# Patient Record
Sex: Female | Born: 1955 | Race: White | Hispanic: No | Marital: Single | Smoking: Former smoker
Health system: Southern US, Community
[De-identification: ages and names within clinical notes are randomized; demographics above are authoritative.]

## PROBLEM LIST (undated history)

## (undated) DIAGNOSIS — E119 Type 2 diabetes mellitus without complications: Secondary | ICD-10-CM

## (undated) DIAGNOSIS — J189 Pneumonia, unspecified organism: Secondary | ICD-10-CM

## (undated) DIAGNOSIS — J309 Allergic rhinitis, unspecified: Secondary | ICD-10-CM

## (undated) DIAGNOSIS — B977 Papillomavirus as the cause of diseases classified elsewhere: Secondary | ICD-10-CM

## (undated) DIAGNOSIS — F329 Major depressive disorder, single episode, unspecified: Secondary | ICD-10-CM

## (undated) DIAGNOSIS — F419 Anxiety disorder, unspecified: Secondary | ICD-10-CM

## (undated) DIAGNOSIS — M1990 Unspecified osteoarthritis, unspecified site: Secondary | ICD-10-CM

## (undated) DIAGNOSIS — F988 Other specified behavioral and emotional disorders with onset usually occurring in childhood and adolescence: Secondary | ICD-10-CM

## (undated) DIAGNOSIS — F32A Depression, unspecified: Secondary | ICD-10-CM

## (undated) DIAGNOSIS — E785 Hyperlipidemia, unspecified: Secondary | ICD-10-CM

## (undated) DIAGNOSIS — I10 Essential (primary) hypertension: Secondary | ICD-10-CM

## (undated) HISTORY — DX: Other specified behavioral and emotional disorders with onset usually occurring in childhood and adolescence: F98.8

## (undated) HISTORY — DX: Hyperlipidemia, unspecified: E78.5

## (undated) HISTORY — PX: OTHER SURGICAL HISTORY: SHX169

## (undated) HISTORY — DX: Allergic rhinitis, unspecified: J30.9

## (undated) HISTORY — PX: ENDOMETRIAL BIOPSY: SHX622

## (undated) HISTORY — DX: Anxiety disorder, unspecified: F41.9

## (undated) HISTORY — DX: Type 2 diabetes mellitus without complications: E11.9

## (undated) HISTORY — DX: Depression, unspecified: F32.A

## (undated) HISTORY — DX: Papillomavirus as the cause of diseases classified elsewhere: B97.7

## (undated) HISTORY — DX: Essential (primary) hypertension: I10

## (undated) HISTORY — PX: LIPOSUCTION: SHX10

## (undated) HISTORY — DX: Major depressive disorder, single episode, unspecified: F32.9

## (undated) HISTORY — PX: BREAST REDUCTION SURGERY: SHX8

---

## 2013-03-13 ENCOUNTER — Other Ambulatory Visit: Payer: Self-pay | Admitting: Family Medicine

## 2013-05-20 ENCOUNTER — Encounter: Payer: Self-pay | Admitting: *Deleted

## 2013-05-20 ENCOUNTER — Ambulatory Visit (INDEPENDENT_AMBULATORY_CARE_PROVIDER_SITE_OTHER): Payer: BC Managed Care – PPO | Admitting: Family Medicine

## 2013-05-20 ENCOUNTER — Encounter: Payer: Self-pay | Admitting: Family Medicine

## 2013-05-20 VITALS — BP 116/73 | HR 112 | Wt 223.0 lb

## 2013-05-20 DIAGNOSIS — F411 Generalized anxiety disorder: Secondary | ICD-10-CM

## 2013-05-20 DIAGNOSIS — R41840 Attention and concentration deficit: Secondary | ICD-10-CM

## 2013-05-20 DIAGNOSIS — IMO0001 Reserved for inherently not codable concepts without codable children: Secondary | ICD-10-CM

## 2013-05-20 DIAGNOSIS — R300 Dysuria: Secondary | ICD-10-CM

## 2013-05-20 LAB — POCT URINALYSIS DIPSTICK
Bilirubin, UA: NEGATIVE
Blood, UA: NEGATIVE
Glucose, UA: POSITIVE
Ketones, UA: NEGATIVE
Leukocytes, UA: NEGATIVE
Nitrite, UA: NEGATIVE
Protein, UA: NEGATIVE
Spec Grav, UA: 1.02
Urobilinogen, UA: NEGATIVE
pH, UA: 5

## 2013-05-20 MED ORDER — PHENAZOPYRIDINE HCL 200 MG PO TABS: 200.0000 mg | ORAL_TABLET | Freq: Three times a day (TID) | ORAL | Status: DC | PRN

## 2013-05-20 MED ORDER — AMPHETAMINE-DEXTROAMPHET ER 30 MG PO CP24: 30.0000 mg | ORAL_CAPSULE | ORAL | Status: DC

## 2013-05-20 MED ORDER — VENLAFAXINE HCL ER 150 MG PO CP24: 150.0000 mg | ORAL_CAPSULE | Freq: Every day | ORAL | Status: DC

## 2013-05-20 MED ORDER — CANAGLIFLOZIN 300 MG PO TABS: 1.0000 | ORAL_TABLET | Freq: Every day | ORAL | Status: DC

## 2013-05-20 MED ORDER — LIRAGLUTIDE 18 MG/3ML ~~LOC~~ SOPN: 1.8000 mg | PEN_INJECTOR | Freq: Every day | SUBCUTANEOUS | Status: DC

## 2013-05-20 MED ORDER — METFORMIN HCL ER (MOD) 1000 MG PO TB24: 1000.0000 mg | ORAL_TABLET | Freq: Two times a day (BID) | ORAL | Status: DC

## 2013-05-20 NOTE — Progress Notes (Signed)
  Subjective:    Patient ID: Danielle Riley, female    DOB: Mar 12, 1956, 57 y.o.   MRN: [id number]   HPI  Danielle Riley is here today to discuss the conditions listed below and for medication refills.   1)  Dysuria:  She has been having some pain with urination and thinks that she might have a UTI.  She describes her discomfort as being moderate and she feels that she is worsening.  She has been taking cranberry supplements which have not helped her.    2)  ADD:  She needs her refill on her Adderall. She is doing fine on her current dosage.    3)  Type II DM:  She is doing fine on the combination of Invokana, metformin and Victoza.    4)  Hyperlipidemia:  She is doing well on pravastatin.     Review of Systems  Constitutional: Negative for activity change, fatigue and unexpected weight change.  HENT: Negative.   Eyes: Negative.   Respiratory: Negative for shortness of breath.   Cardiovascular: Negative for chest pain, palpitations and leg swelling.  Gastrointestinal: Negative for diarrhea and constipation.  Endocrine: Negative.   Genitourinary: Negative for difficulty urinating.  Musculoskeletal: Negative.   Skin: Negative.   Neurological: Negative.   Hematological: Negative for adenopathy. Does not bruise/bleed easily.  Psychiatric/Behavioral: Negative for sleep disturbance and dysphoric mood. The patient is not nervous/anxious.    Past Medical History  Diagnosis Date  . Hypertension   . Hyperlipidemia   . Diabetes   . ADD (attention deficit disorder)   . Anxiety   . Depression   . Allergic rhinitis   . High risk HPV infection       Family History  Problem Relation Age of Onset  . Depression Mother   . Diabetes Paternal Grandmother     History   Social History Narrative   Marital Status: Single   Children:  None    Pets: None   Living Situation: Lives alone    Occupation:  [employer]) [place]    Education:  Manufacturing engineer (Education); Specialist  ([place]) Alcohol Use:  None   Tobacco Use:  Former smoker   Alcohol:  Occasional    Drug Use:  None   Diet:  Regular   Exercise:  Gym Engineer, materials) 2 X per week   Hobbies: Knitting, Crafts,  [employer]          Objective:   Physical Exam  Constitutional: She appears well-nourished. No distress.  HENT:  Head: Normocephalic.  Eyes: No scleral icterus.  Neck: No thyromegaly present.  Cardiovascular: Normal rate, regular rhythm and normal heart sounds.   Pulmonary/Chest: Effort normal and breath sounds normal.  Abdominal: There is no tenderness.  Musculoskeletal: She exhibits no edema and no tenderness.  Neurological: She is alert.  Skin: Skin is warm and dry.  Psychiatric: She has a normal mood and affect. Her behavior is normal. Judgment and thought content normal.      Assessment & Plan:

## 2013-05-24 ENCOUNTER — Ambulatory Visit: Payer: Self-pay | Admitting: Family Medicine

## 2013-05-28 ENCOUNTER — Other Ambulatory Visit: Payer: Self-pay | Admitting: Family Medicine

## 2013-06-13 ENCOUNTER — Encounter: Payer: Self-pay | Admitting: Family Medicine

## 2013-06-13 DIAGNOSIS — F411 Generalized anxiety disorder: Secondary | ICD-10-CM | POA: Insufficient documentation

## 2013-06-13 DIAGNOSIS — IMO0001 Reserved for inherently not codable concepts without codable children: Secondary | ICD-10-CM | POA: Insufficient documentation

## 2013-06-13 DIAGNOSIS — R300 Dysuria: Secondary | ICD-10-CM | POA: Insufficient documentation

## 2013-06-13 DIAGNOSIS — R41840 Attention and concentration deficit: Secondary | ICD-10-CM | POA: Insufficient documentation

## 2013-06-13 NOTE — Assessment & Plan Note (Signed)
Refilled her Invokana, metformin and Victoza.

## 2013-06-13 NOTE — Assessment & Plan Note (Signed)
Her urine does not appear to be infected.  She was given some Pyridium to help with her dysuria.

## 2013-06-13 NOTE — Assessment & Plan Note (Signed)
Refilled her Adderall XR.

## 2013-06-13 NOTE — Assessment & Plan Note (Signed)
Refilled her Effexor

## 2013-06-17 ENCOUNTER — Telehealth: Payer: Self-pay

## 2013-06-17 NOTE — Telephone Encounter (Signed)
I called to get a prior auth for Adderall medication. Approval is from 05/27/2013- 06/17/2014 and the case# is [id number]  The information above has been faxed to the pharmacy at this time. LB

## 2013-08-20 ENCOUNTER — Ambulatory Visit: Payer: BC Managed Care – PPO | Admitting: Family Medicine

## 2013-08-27 ENCOUNTER — Ambulatory Visit: Payer: BC Managed Care – PPO | Admitting: Family Medicine

## 2013-08-27 ENCOUNTER — Ambulatory Visit (INDEPENDENT_AMBULATORY_CARE_PROVIDER_SITE_OTHER): Payer: BC Managed Care – PPO | Admitting: Family Medicine

## 2013-08-27 ENCOUNTER — Encounter: Payer: Self-pay | Admitting: Family Medicine

## 2013-08-27 VITALS — BP 130/78 | HR 90 | Resp 16 | Ht 65.0 in | Wt 220.0 lb

## 2013-08-27 DIAGNOSIS — E781 Pure hyperglyceridemia: Secondary | ICD-10-CM

## 2013-08-27 DIAGNOSIS — T2220XA Burn of second degree of shoulder and upper limb, except wrist and hand, unspecified site, initial encounter: Secondary | ICD-10-CM

## 2013-08-27 DIAGNOSIS — G4700 Insomnia, unspecified: Secondary | ICD-10-CM

## 2013-08-27 DIAGNOSIS — R41840 Attention and concentration deficit: Secondary | ICD-10-CM

## 2013-08-27 DIAGNOSIS — I10 Essential (primary) hypertension: Secondary | ICD-10-CM

## 2013-08-27 DIAGNOSIS — N951 Menopausal and female climacteric states: Secondary | ICD-10-CM

## 2013-08-27 DIAGNOSIS — D649 Anemia, unspecified: Secondary | ICD-10-CM

## 2013-08-27 DIAGNOSIS — F411 Generalized anxiety disorder: Secondary | ICD-10-CM

## 2013-08-27 DIAGNOSIS — Z23 Encounter for immunization: Secondary | ICD-10-CM

## 2013-08-27 DIAGNOSIS — E119 Type 2 diabetes mellitus without complications: Secondary | ICD-10-CM

## 2013-08-27 LAB — POCT GLYCOSYLATED HEMOGLOBIN (HGB A1C): Hemoglobin A1C: 6.7

## 2013-08-27 MED ORDER — AMPHETAMINE-DEXTROAMPHET ER 30 MG PO CP24: 30.0000 mg | ORAL_CAPSULE | ORAL | Status: DC

## 2013-08-27 MED ORDER — LIRAGLUTIDE 18 MG/3ML ~~LOC~~ SOPN: 1.8000 mg | PEN_INJECTOR | Freq: Every day | SUBCUTANEOUS | Status: DC

## 2013-08-27 MED ORDER — MULTIGEN PLUS 151-60-1 MG PO TABS: 1.0000 | ORAL_TABLET | Freq: Two times a day (BID) | ORAL | Status: DC

## 2013-08-27 MED ORDER — PROGESTERONE MICRONIZED 100 MG PO CAPS: 100.0000 mg | ORAL_CAPSULE | Freq: Every day | ORAL | Status: DC

## 2013-08-27 MED ORDER — LISINOPRIL-HYDROCHLOROTHIAZIDE 20-25 MG PO TABS: 1.0000 | ORAL_TABLET | Freq: Every day | ORAL | Status: DC

## 2013-08-27 MED ORDER — OMEGA-3-ACID ETHYL ESTERS 1 G PO CAPS: 2.0000 g | ORAL_CAPSULE | Freq: Two times a day (BID) | ORAL | Status: DC

## 2013-08-27 NOTE — Progress Notes (Signed)
Subjective:    Patient ID: Danielle Riley, female    DOB: 1956-07-13, 57 y.o.   MRN: [id number]  HPI  Danielle Riley is here today to discuss the conditions listed below and to have several of her medications refilled.     1)  Burn:  She burned her upper left arm a few days ago while using a hot glue stick at work.  She has been keeping the wound clean and has been applying Neosporin and an OTC silver gel the pharmacist told her to get.  She would like to have the burn evaluated to be sure that it is not infected.  She denies pain, warmth or discharge from the wound.    2) Type II DM:  She had been taking the combination of Victoza, Metformin and Invokana.  She did run out of Victoza a few weeks ago and can tell that her sugars have increased.  She continues to work hard on her diet.    3)  ADD: She needs a refill of her ADD medication (Adderall XR 30 mg).  She says that the medication continues to help her concentrate at work.  She feels that this dosage is appropriate and would like to continue on it.    4)  Mood:  She continues to do well with her Effexor 150 mg.  She needs a refill on it as well.       Review of Systems  Constitutional: Negative.   HENT: Negative.   Eyes: Negative.   Respiratory: Negative.   Cardiovascular: Negative.   Gastrointestinal: Negative.   Endocrine: Negative.   Genitourinary: Negative.   Musculoskeletal: Negative.   Skin: Positive for wound.       left upper arm.   Allergic/Immunologic: Negative.   Neurological: Negative.   Hematological: Negative.   Psychiatric/Behavioral: Negative.      Past Medical History  Diagnosis Date  . Hypertension   . Hyperlipidemia   . Diabetes   . ADD (attention deficit disorder)   . Anxiety   . Depression   . Allergic rhinitis   . High risk HPV infection      Family History  Problem Relation Age of Onset  . Depression Mother   . 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 Constitutional: She is oriented to person, place, and time. She appears well-developed and well-nourished.  Eyes: Conjunctivae are normal. No scleral icterus.  Neck: Neck supple. No thyromegaly present.  Cardiovascular: Normal rate, regular rhythm and normal heart sounds.   Pulmonary/Chest: Effort normal and breath sounds normal.  Musculoskeletal: She exhibits no edema and no tenderness.       Arms: Lymphadenopathy:    She has no cervical adenopathy.  Neurological: She is alert and oriented to person, place, and time.  Skin: Skin is warm and dry.  Psychiatric: She has a  normal mood and affect. Her behavior is normal. Judgment and thought content normal.          Assessment & Plan:

## 2013-09-07 ENCOUNTER — Other Ambulatory Visit: Payer: Self-pay | Admitting: Family Medicine

## 2013-10-31 DIAGNOSIS — E119 Type 2 diabetes mellitus without complications: Secondary | ICD-10-CM | POA: Insufficient documentation

## 2013-10-31 DIAGNOSIS — E781 Pure hyperglyceridemia: Secondary | ICD-10-CM | POA: Insufficient documentation

## 2013-10-31 DIAGNOSIS — I10 Essential (primary) hypertension: Secondary | ICD-10-CM | POA: Insufficient documentation

## 2013-10-31 DIAGNOSIS — N951 Menopausal and female climacteric states: Secondary | ICD-10-CM | POA: Insufficient documentation

## 2013-10-31 DIAGNOSIS — D649 Anemia, unspecified: Secondary | ICD-10-CM | POA: Insufficient documentation

## 2013-10-31 DIAGNOSIS — G4700 Insomnia, unspecified: Secondary | ICD-10-CM | POA: Insufficient documentation

## 2013-10-31 DIAGNOSIS — T2220XA Burn of second degree of shoulder and upper limb, except wrist and hand, unspecified site, initial encounter: Secondary | ICD-10-CM | POA: Insufficient documentation

## 2013-10-31 DIAGNOSIS — Z23 Encounter for immunization: Secondary | ICD-10-CM | POA: Insufficient documentation

## 2013-10-31 NOTE — Assessment & Plan Note (Signed)
Refilled her Adderall XR.

## 2013-10-31 NOTE — Assessment & Plan Note (Signed)
She was given a refill for her Lovaza.

## 2013-10-31 NOTE — Assessment & Plan Note (Signed)
The patient confirmed that they are not allergic to eggs and have never had a bad reaction with the flu shot in the past.  The vaccination was given without difficulty.   

## 2013-10-31 NOTE — Assessment & Plan Note (Addendum)
Her mood is stable on Effexor so she will remain on the 150 mg which was refilled.

## 2013-10-31 NOTE — Assessment & Plan Note (Signed)
Danielle Riley's A1c is a little better than it was at her last check 6 months ago (6.7% vs 6.8%).  She is going to continue on the combination of Victoza, metformin and Invokana and will continue to work on her diet and exercise.

## 2013-10-31 NOTE — Assessment & Plan Note (Signed)
Her BP is controlled on the lisinopril/HCT so she'll remain on her current dosage.

## 2013-10-31 NOTE — Assessment & Plan Note (Signed)
She is to continue on the Prometrium to help both her mood and her sleep.

## 2013-10-31 NOTE — Assessment & Plan Note (Signed)
Refilled her Multigen.

## 2013-11-16 ENCOUNTER — Other Ambulatory Visit: Payer: Self-pay | Admitting: *Deleted

## 2013-11-16 DIAGNOSIS — E119 Type 2 diabetes mellitus without complications: Secondary | ICD-10-CM

## 2013-11-16 DIAGNOSIS — R5381 Other malaise: Secondary | ICD-10-CM

## 2013-11-16 DIAGNOSIS — R5383 Other fatigue: Secondary | ICD-10-CM

## 2013-11-16 DIAGNOSIS — E785 Hyperlipidemia, unspecified: Secondary | ICD-10-CM

## 2013-11-17 ENCOUNTER — Other Ambulatory Visit: Payer: BC Managed Care – PPO

## 2013-11-17 LAB — COMPLETE METABOLIC PANEL WITHOUT GFR
ALT: 14 U/L (ref 0–35)
AST: 14 U/L (ref 0–37)
Albumin: 4.3 g/dL (ref 3.5–5.2)
Alkaline Phosphatase: 87 U/L (ref 39–117)
BUN: 18 mg/dL (ref 6–23)
CO2: 27 meq/L (ref 19–32)
Calcium: 9.6 mg/dL (ref 8.4–10.5)
Chloride: 101 meq/L (ref 96–112)
Creat: 0.57 mg/dL (ref 0.50–1.10)
GFR, Est African American: >89 mL/min
GFR, Est Non African American: >89 mL/min
Glucose, Bld: 225 mg/dL — ABNORMAL HIGH (ref 70–99)
Potassium: 4.6 meq/L (ref 3.5–5.3)
Sodium: 136 meq/L (ref 135–145)
Total Bilirubin: 0.6 mg/dL (ref 0.3–1.2)
Total Protein: 6.7 g/dL (ref 6.0–8.3)

## 2013-11-17 LAB — CBC WITH DIFFERENTIAL/PLATELET
Basophils Absolute: 0 10*3/uL (ref 0.0–0.1)
Basophils Relative: 1 % (ref 0–1)
Eosinophils Absolute: 0.2 10*3/uL (ref 0.0–0.7)
Eosinophils Relative: 3 % (ref 0–5)
HCT: 42.5 % (ref 36.0–46.0)
Hemoglobin: 14.4 g/dL (ref 12.0–15.0)
Lymphocytes Relative: 46 % (ref 12–46)
Lymphs Abs: 2.9 10*3/uL (ref 0.7–4.0)
MCH: 28.6 pg (ref 26.0–34.0)
MCHC: 33.9 g/dL (ref 30.0–36.0)
MCV: 84.5 fL (ref 78.0–100.0)
Monocytes Absolute: 0.4 10*3/uL (ref 0.1–1.0)
Monocytes Relative: 7 % (ref 3–12)
Neutro Abs: 2.7 10*3/uL (ref 1.7–7.7)
Neutrophils Relative %: 43 % (ref 43–77)
Platelets: 285 10*3/uL (ref 150–400)
RBC: 5.03 MIL/uL (ref 3.87–5.11)
RDW: 13.8 % (ref 11.5–15.5)
WBC: 6.3 10*3/uL (ref 4.0–10.5)

## 2013-11-17 LAB — TSH: TSH: 1.165 u[IU]/mL (ref 0.350–4.500)

## 2013-11-17 LAB — LIPID PANEL
Cholesterol: 186 mg/dL (ref 0–200)
HDL: 58 mg/dL
LDL Cholesterol: 102 mg/dL — ABNORMAL HIGH (ref 0–99)
Total CHOL/HDL Ratio: 3.2 ratio
Triglycerides: 132 mg/dL
VLDL: 26 mg/dL (ref 0–40)

## 2013-11-18 LAB — HEMOGLOBIN A1C
Hgb A1c MFr Bld: 9.3 % — ABNORMAL HIGH (ref <5.7)
Mean Plasma Glucose: 220 mg/dL — ABNORMAL HIGH (ref <117)

## 2013-11-22 ENCOUNTER — Other Ambulatory Visit: Payer: BC Managed Care – PPO

## 2013-11-26 ENCOUNTER — Ambulatory Visit (INDEPENDENT_AMBULATORY_CARE_PROVIDER_SITE_OTHER): Payer: BC Managed Care – PPO | Admitting: Family Medicine

## 2013-11-26 ENCOUNTER — Encounter (INDEPENDENT_AMBULATORY_CARE_PROVIDER_SITE_OTHER): Payer: Self-pay

## 2013-11-26 ENCOUNTER — Encounter: Payer: Self-pay | Admitting: Family Medicine

## 2013-11-26 VITALS — BP 130/83 | HR 102 | Resp 16 | Wt 222.0 lb

## 2013-11-26 DIAGNOSIS — E119 Type 2 diabetes mellitus without complications: Secondary | ICD-10-CM

## 2013-11-26 DIAGNOSIS — I10 Essential (primary) hypertension: Secondary | ICD-10-CM

## 2013-11-26 DIAGNOSIS — R41840 Attention and concentration deficit: Secondary | ICD-10-CM

## 2013-11-26 MED ORDER — LIRAGLUTIDE 18 MG/3ML ~~LOC~~ SOPN: 1.8000 mg | PEN_INJECTOR | Freq: Every day | SUBCUTANEOUS | Status: DC

## 2013-11-26 MED ORDER — ALBIGLUTIDE 30 MG ~~LOC~~ PEN: 30.0000 mg | PEN_INJECTOR | SUBCUTANEOUS | Status: AC

## 2013-11-26 MED ORDER — VENLAFAXINE HCL ER 150 MG PO CP24: 150.0000 mg | ORAL_CAPSULE | Freq: Every day | ORAL | Status: DC

## 2013-11-26 MED ORDER — CANAGLIFLOZIN-METFORMIN HCL 150-1000 MG PO TABS: 1.0000 | ORAL_TABLET | Freq: Two times a day (BID) | ORAL | Status: DC

## 2013-11-26 MED ORDER — AMPHETAMINE-DEXTROAMPHET ER 30 MG PO CP24: 30.0000 mg | ORAL_CAPSULE | ORAL | Status: DC

## 2013-11-26 MED ORDER — LISINOPRIL-HYDROCHLOROTHIAZIDE 20-25 MG PO TABS: 1.0000 | ORAL_TABLET | Freq: Every day | ORAL | Status: DC

## 2013-11-26 NOTE — Progress Notes (Signed)
Subjective:    Patient ID: Danielle Riley, female    DOB: 1956-06-19, 57 y.o.   MRN: [id number]  HPI  Danielle Riley is here today to go over his most recent lab results and to discuss the conditions listed below:    1)  Type II DM:  She has been very stressed with her new job and admits not taking her medications as she should.  She took the samples of Invokomet and did well on them but did not get it filled because she lost the prescription.    2)   ADD:  She is here today to get a refill on her ADD medication (Adderall XR 30 mg).  She says that the medication continues to help her concentrate at work.  She feels that this dosage is appropriate and would like to continue on it.    3)  Hyperlipidemia:  She is doing well on her pravastatin (40 mg once, daily).  She needs a refill on it.    4)  Hypertension:  Her blood pressure is controlled with her Lisinopril/HCTZ (20-25 mg, 1/2 pill daily)   5)  Anxiety/Depression:  She continues taking her Effexor.  She is having a lot of stress at work.     Review of Systems  Constitutional: Positive for fatigue. Negative for activity change, appetite change and unexpected weight change.  HENT: Negative.   Eyes: Negative.   Respiratory: Negative for chest tightness and shortness of breath.   Cardiovascular: Negative for chest pain, palpitations and leg swelling.  Gastrointestinal: Negative for diarrhea and constipation.  Endocrine: Negative.  Negative for polydipsia, polyphagia and polyuria.  Genitourinary: Negative for urgency, frequency and difficulty urinating.  Musculoskeletal: Negative.   Skin: Negative.   Neurological: Negative.  Negative for light-headedness and numbness.  Hematological: Negative for adenopathy. Does not bruise/bleed easily.  Psychiatric/Behavioral: Negative for sleep disturbance and dysphoric mood. The patient is not nervous/anxious.        She is very stressed.      Past Medical History  Diagnosis Date  . Hypertension    . Hyperlipidemia   . Diabetes   . ADD (attention deficit disorder)   . Anxiety   . Depression   . Allergic rhinitis   . High risk HPV infection      Past Surgical History  Procedure Laterality Date  . Endometrial biopsy    . Breast reduction surgery    . 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 Depression Mother   . Diabetes Paternal Grandmother      Current Outpatient Prescriptions on File Prior to Visit  Medication Sig Dispense Refill  . aspirin 81 MG tablet Take 81 mg by mouth daily.      Marland Kitchen EPIPEN 2-PAK 0.3 MG/0.3ML SOAJ Inject 0.3 mg into  the muscle once as needed (for allergies).       . FeAsp-FeFum -Suc-C-Thre-B12-FA (MULTIGEN PLUS) 50-101-1 MG TABS Take 1 tablet by mouth 2 (two) times daily.  180 each  3  . Lancets (ONETOUCH ULTRASOFT) lancets       . omega-3 acid ethyl esters (LOVAZA) 1 G capsule Take 2 capsules (2 g total) by mouth 2 (two) times daily.  120 capsule  11  . ONE TOUCH ULTRA TEST test strip       . pravastatin (PRAVACHOL) 40 MG tablet Take 40 mg by mouth daily.       . progesterone (PROMETRIUM) 100 MG capsule Take 1 capsule (100 mg total) by mouth at bedtime.  90 capsule  3   No current facility-administered medications on file prior to visit.     Allergies  Allergen Reactions  . Lemon Oil Anaphylaxis  . Other Anaphylaxis    pickles  . Advil [Ibuprofen] Swelling  . Peanuts [Peanut Oil] Diarrhea    Reports meds are okay; only  allergic to food     Immunization History  Administered Date(s) Administered  . Influenza,inj,Quad PF,36+ Mos 08/27/2013  . Td 05/13/2003  . Tdap 09/19/2009       Objective:   Physical Exam  Vitals reviewed. Constitutional: She is oriented to person, place, and time. She appears well-developed and well-nourished. She does not appear ill.  HENT:  Head: Normocephalic and atraumatic. Hair is normal.  Mouth/Throat: Oropharynx is clear and moist.  Eyes: Conjunctivae are normal. No scleral icterus.  Neck: Neck supple. No JVD present. No thyromegaly present.  Cardiovascular: Normal rate and regular rhythm.  Exam reveals no gallop and no friction rub.   No murmur heard. Pulmonary/Chest: Effort normal and breath sounds normal. No respiratory distress. She has no wheezes. She exhibits no tenderness.  Abdominal: Soft. She exhibits no distension and no mass. There is no tenderness.  Musculoskeletal: Normal range of motion. She exhibits no edema and no tenderness.  Lymphadenopathy:    She has no cervical adenopathy.  Neurological: She is alert and oriented to person, place, and time.  Skin: Skin is warm and dry. No rash noted.  Psychiatric: She has a normal mood and affect. Her behavior is normal. Judgment and thought content normal.  She appears stressed.       Assessment & Plan:    Danine was seen today for medication management.  Diagnoses and associated orders for this visit:    We are going to make some changes in her diabetic medications to get her sugar under better control.    Concentration deficit - amphetamine-dextroamphetamine (ADDERALL XR) 30 MG 24 hr capsule; Take 1 capsule (30 mg total) by mouth every morning.  Essential hypertension, benign - lisinopril-hydrochlorothiazide (PRINZIDE,ZESTORETIC) 20-25 MG per tablet; Take 1 tablet by mouth daily.  Type II or unspecified type diabetes mellitus without mention of complication, not stated as uncontrolled - Albiglutide 30  MG PEN; Inject 30 mg into the skin once a week. - Liraglutide (VICTOZA) 18 MG/3ML SOPN; Inject 1.8 mg into the skin daily.  Other Orders - venlafaxine XR (EFFEXOR-XR) 150 MG 24 hr capsule; Take 1 capsule (150 mg total) by mouth daily with breakfast. - Canagliflozin-Metformin HCl ([phone] MG TABS; Take 1 tablet by mouth 2 (two) times daily.

## 2014-01-26 ENCOUNTER — Encounter (HOSPITAL_COMMUNITY): Payer: Self-pay | Admitting: Emergency Medicine

## 2014-01-26 ENCOUNTER — Emergency Department (HOSPITAL_COMMUNITY)
Admission: EM | Admit: 2014-01-26 | Discharge: 2014-01-26 | Disposition: A | Discharge status: Home / Self Care | Payer: Worker's Compensation | Attending: Emergency Medicine | Admitting: Emergency Medicine

## 2014-01-26 DIAGNOSIS — R209 Unspecified disturbances of skin sensation: Secondary | ICD-10-CM | POA: Insufficient documentation

## 2014-01-26 DIAGNOSIS — E119 Type 2 diabetes mellitus without complications: Secondary | ICD-10-CM | POA: Insufficient documentation

## 2014-01-26 DIAGNOSIS — Z7982 Long term (current) use of aspirin: Secondary | ICD-10-CM | POA: Insufficient documentation

## 2014-01-26 DIAGNOSIS — Z8709 Personal history of other diseases of the respiratory system: Secondary | ICD-10-CM | POA: Insufficient documentation

## 2014-01-26 DIAGNOSIS — Z87891 Personal history of nicotine dependence: Secondary | ICD-10-CM | POA: Insufficient documentation

## 2014-01-26 DIAGNOSIS — Z79899 Other long term (current) drug therapy: Secondary | ICD-10-CM | POA: Insufficient documentation

## 2014-01-26 DIAGNOSIS — I10 Essential (primary) hypertension: Secondary | ICD-10-CM | POA: Insufficient documentation

## 2014-01-26 DIAGNOSIS — Z8619 Personal history of other infectious and parasitic diseases: Secondary | ICD-10-CM | POA: Insufficient documentation

## 2014-01-26 DIAGNOSIS — X500XXA Overexertion from strenuous movement or load, initial encounter: Secondary | ICD-10-CM | POA: Insufficient documentation

## 2014-01-26 DIAGNOSIS — Y929 Unspecified place or not applicable: Secondary | ICD-10-CM | POA: Insufficient documentation

## 2014-01-26 DIAGNOSIS — E785 Hyperlipidemia, unspecified: Secondary | ICD-10-CM | POA: Insufficient documentation

## 2014-01-26 DIAGNOSIS — M549 Dorsalgia, unspecified: Secondary | ICD-10-CM

## 2014-01-26 DIAGNOSIS — Y939 Activity, unspecified: Secondary | ICD-10-CM | POA: Insufficient documentation

## 2014-01-26 DIAGNOSIS — F988 Other specified behavioral and emotional disorders with onset usually occurring in childhood and adolescence: Secondary | ICD-10-CM | POA: Insufficient documentation

## 2014-01-26 DIAGNOSIS — IMO0002 Reserved for concepts with insufficient information to code with codable children: Secondary | ICD-10-CM | POA: Insufficient documentation

## 2014-01-26 DIAGNOSIS — F411 Generalized anxiety disorder: Secondary | ICD-10-CM | POA: Insufficient documentation

## 2014-01-26 MED ORDER — HYDROCODONE-ACETAMINOPHEN 5-325 MG PO TABS: 1.0000 | ORAL_TABLET | Freq: Four times a day (QID) | ORAL | Status: DC | PRN

## 2014-01-26 NOTE — ED Notes (Signed)
Burning pain in lower back radiating to buttocks and legs; pain is worse with position changes and better with palpation and pressure.

## 2014-01-26 NOTE — Discharge Instructions (Signed)
SEEK IMMEDIATE MEDICAL ATTENTION IF: New numbness, tingling, weakness, or problem with the use of your arms or legs.  Severe back pain not relieved with medications.  Change in bowel or bladder control.  Increasing pain in any areas of the body (such as chest or abdominal pain).  Shortness of breath, dizziness or fainting.  Nausea (feeling sick to your stomach), vomiting, fever, or sweats.  Do not drive, operate heavy machinery, drink alcohol, or take other tylenol containing products with this Norco..  Back Pain, Adult Low back pain is very common. About 1 in 5 people have back pain.The cause of low back pain is rarely dangerous. The pain often gets better over time.About half of people with a sudden onset of back pain feel better in just 2 weeks. About 8 in 10 people feel better by 6 weeks.  CAUSES Some common causes of back pain include:  Strain of the muscles or ligaments supporting the spine.  Wear and tear (degeneration) of the spinal discs.  Arthritis.  Direct injury to the back. DIAGNOSIS Most of the time, the direct cause of low back pain is not known.However, back pain can be treated effectively even when the exact cause of the pain is unknown.Answering your caregiver's questions about your overall health and symptoms is one of the most accurate ways to make sure the cause of your pain is not dangerous. If your caregiver needs more information, he or she may order lab work or imaging tests (X-rays or MRIs).However, even if imaging tests show changes in your back, this usually does not require surgery. HOME CARE INSTRUCTIONS For many people, back pain returns.Since low back pain is rarely dangerous, it is often a condition that people can learn to [facility] their own.   Remain active. It is stressful on the back to sit or stand in one place. Do not sit, drive, or stand in one place for more than 30 minutes at a time. Take short walks on level surfaces as soon as pain  allows.Try to increase the length of time you walk each day.  Do not stay in bed.Resting more than 1 or 2 days can delay your recovery.  Do not avoid exercise or work.Your body is made to move.It is not dangerous to be active, even though your back may hurt.Your back will likely heal faster if you return to being active before your pain is gone.  Pay attention to your body when you bend and lift. Many people have less discomfortwhen lifting if they bend their knees, keep the load close to their bodies,and avoid twisting. Often, the most comfortable positions are those that put less stress on your recovering back.  Find a comfortable position to sleep. Use a firm mattress and lie on your side with your knees slightly bent. If you lie on your back, put a pillow under your knees.  Only take over-the-counter or prescription medicines as directed by your caregiver. Over-the-counter medicines to reduce pain and inflammation are often the most helpful.Your caregiver may prescribe muscle relaxant drugs.These medicines help dull your pain so you can more quickly return to your normal activities and healthy exercise.  Put ice on the injured area.  Put ice in a plastic bag.  Place a towel between your skin and the bag.  Leave the ice on for 15-20 minutes, 03-04 times a day for the first 2 to 3 days. After that, ice and heat may be alternated to reduce pain and spasms.  Ask your caregiver about  trying back exercises and gentle massage. This may be of some benefit.  Avoid feeling anxious or stressed.Stress increases muscle tension and can worsen back pain.It is important to recognize when you are anxious or stressed and learn ways to manage it.Exercise is a great option. SEEK MEDICAL CARE IF:  You have pain that is not relieved with rest or medicine.  You have pain that does not improve in 1 week.  You have new symptoms.  You are generally not feeling well. SEEK IMMEDIATE MEDICAL CARE  IF:   You have pain that radiates from your back into your legs.  You develop new bowel or bladder control problems.  You have unusual weakness or numbness in your arms or legs.  You develop nausea or vomiting.  You develop abdominal pain.  You feel faint. Document Released: 12/09/2005 Document Revised: 06/09/2012 Document Reviewed: 04/29/2011 [facility] Patient Information 2014 [place], [place].

## 2014-01-26 NOTE — ED Notes (Signed)
Pt reports tripping this AM on a desk and throughout day her lower back has been hurting; reports tingling in legs; denies loss in bowel function; pt ambulatory to triage

## 2014-01-26 NOTE — ED Notes (Signed)
Notified lab of workers comp; pt has paperwork at bedside; pt instructed to not use bathroom until phlebotomy comes to bedside

## 2014-01-26 NOTE — ED Provider Notes (Signed)
CSN: [id number]     Arrival date & time 01/26/14  1907 History  This chart was scribed for non-physician practitioner working with Juliet RudeNathan R. Rubin PayorPickering, MD by Donne Anonayla Curran, ED Scribe. This patient was seen in room TR04C/TR04C and the patient's care was started at 1938.    First MD Initiated Contact with Patient 01/26/14 1938     Chief Complaint  Patient presents with  . Back Pain    The history is provided by the patient. No language interpreter was used.   HPI Comments: Danielle Riley is a 58 y.o. female who presents to the Emergency Department complaining of sudden onset, gradually worsening, moderate lower back pain that radiates into her buttock and is described as dull and burning that began. It began this morning when she tripped and hurt her back. She did not fall, hit her head or lose consciousness. She reports mild tingling in her legs. Standing up straight makes the pain worse. Pulling her knees up to her chest makes the pain better. She tried muscle ache relief patches on her back with moderate relief. She denies bowel or bladder incontinence, weakness in her lower extremities, rashes, neck pain, neck stiffness, HA, photophobia or any other minutes. She reports she has had similar back spasms before.   Past Medical History  Diagnosis Date  . Hypertension   . Hyperlipidemia   . Diabetes   . ADD (attention deficit disorder)   . Anxiety   . Depression   . Allergic rhinitis   . High risk HPV infection    Past Surgical History  Procedure Laterality Date  . Endometrial biopsy    . Breast reduction surgery    . Other surgical history      ECC   Family History  Problem Relation Age of Onset  . Depression Mother   . Diabetes Paternal Grandmother    History  Substance Use Topics  . Smoking status: Former Smoker    Types: Cigarettes    Quit date: 06/13/1990  . Smokeless tobacco: Never Used  . Alcohol Use: No   OB History   Grav Para Term Preterm Abortions TAB SAB Ect Mult  Living                 Review of Systems  Eyes: Negative for photophobia.  Musculoskeletal: Positive for back pain. Negative for neck pain and neck stiffness.  Skin: Negative for rash.  Neurological: Negative for headaches.    Allergies  Lemon oil; Other; Advil; and Peanuts  Home Medications   Current Outpatient Rx  Name  Route  Sig  Dispense  Refill  . Albiglutide 30 MG PEN   Subcutaneous   Inject 30 mg into the skin once a week.   4 each   5   . amphetamine-dextroamphetamine (ADDERALL XR) 30 MG 24 hr capsule   Oral   Take 1 capsule (30 mg total) by mouth every morning.   90 capsule   0   . aspirin 81 MG tablet   Oral   Take 81 mg by mouth daily.         . Canagliflozin-Metformin HCl [id number] MG TABS   Oral   Take 1 tablet by mouth 2 (two) times daily.   60 tablet   5   . EPIPEN 2-PAK 0.3 MG/0.3ML SOAJ               . FeAsp-FeFum -Suc-C-Thre-B12-FA (MULTIGEN PLUS) 50-101-1 MG TABS   Oral   Take 1 tablet by  mouth 2 (two) times daily.   180 each   3   . Lancets (ONETOUCH ULTRASOFT) lancets               . Liraglutide (VICTOZA) 18 MG/3ML SOPN   Subcutaneous   Inject 1.8 mg into the skin daily.   3 pen   5   . lisinopril-hydrochlorothiazide (PRINZIDE,ZESTORETIC) 20-25 MG per tablet   Oral   Take 1 tablet by mouth daily.   90 tablet   1   . omega-3 acid ethyl esters (LOVAZA) 1 G capsule   Oral   Take 2 capsules (2 g total) by mouth 2 (two) times daily.   120 capsule   11   . ONE TOUCH ULTRA TEST test strip               . pravastatin (PRAVACHOL) 40 MG tablet               . progesterone (PROMETRIUM) 100 MG capsule   Oral   Take 1 capsule (100 mg total) by mouth at bedtime.   90 capsule   3   . venlafaxine XR (EFFEXOR-XR) 150 MG 24 hr capsule   Oral   Take 1 capsule (150 mg total) by mouth daily with breakfast.   90 capsule   0    BP 148/72  Pulse 105  Temp(Src) 97.8 F (36.6 C) (Oral)  Resp 17  Ht 5\' 10"  (1.778 m)   Wt 225 lb (102.059 kg)  BMI 32.28 kg/m2  SpO2 96%  Physical Exam  Nursing note and vitals reviewed. Constitutional: She appears well-developed and well-nourished. No distress.  HENT:  Head: Normocephalic and atraumatic.  Eyes: Conjunctivae are normal.  Neck: Neck supple. No tracheal deviation present.  Cardiovascular: Normal rate.   Pulmonary/Chest: Effort normal. No respiratory distress.  Musculoskeletal: Normal range of motion. She exhibits tenderness.  No cervical, thoracic or lumbar midline tenderness to palpation. Extension and lateral flexion with pain. Normal gait. Normal coordination. Tender to palpation across sacrum, top of iliac crest and lower lumbar paraspinal muscles.   Neurological: She is alert.  Skin: Skin is warm and dry.  Psychiatric: She has a normal mood and affect. Her behavior is normal.    ED Course  Procedures (including critical care time) DIAGNOSTIC STUDIES: Oxygen Saturation is 96% on RA, adequate by my interpretation.    COORDINATION OF CARE: 8:23 PM Discussed treatment plan which includes NSAIDS, pain medication, stretching, heat/icewith pt at bedside and pt agreed to plan.    Labs Review Labs Reviewed - No data to display Imaging Review No results found.  EKG Interpretation   None       MDM   1. Back pain    Patient with back pain.  No neurological deficits and normal neuro exam.  Patient can walk but states is painful.  No loss of bowel or bladder control.  No concern for cauda equina.  No fever, night sweats, weight loss, h/o cancer, IVDU.  RICE protocol and pain medicine indicated and discussed with patient.   I personally performed the services described in this documentation, which was scribed in my presence. The recorded information has been reviewed and is accurate.     Arthor Captain, PA-C 01/28/14 1027

## 2014-01-26 NOTE — ED Notes (Signed)
Pt comfortable with d/c and f/u instructions. Prescriptions x1 

## 2014-01-31 NOTE — ED Provider Notes (Signed)
Medical screening examination/treatment/procedure(s) were performed by non-physician practitioner and as supervising physician I was immediately available for consultation/collaboration.  EKG Interpretation   None        Caige Almeda R. Ramonica Grigg, MD 01/31/14 2043 

## 2014-03-01 ENCOUNTER — Other Ambulatory Visit: Payer: Self-pay | Admitting: *Deleted

## 2014-03-01 MED ORDER — VENLAFAXINE HCL ER 150 MG PO CP24: 150.0000 mg | ORAL_CAPSULE | Freq: Every day | ORAL | Status: DC

## 2014-03-07 ENCOUNTER — Ambulatory Visit (INDEPENDENT_AMBULATORY_CARE_PROVIDER_SITE_OTHER): Payer: BC Managed Care – PPO | Admitting: Family Medicine

## 2014-03-07 ENCOUNTER — Encounter: Payer: Self-pay | Admitting: Family Medicine

## 2014-03-07 VITALS — BP 106/74 | HR 101 | Resp 16 | Wt 227.0 lb

## 2014-03-07 DIAGNOSIS — F411 Generalized anxiety disorder: Secondary | ICD-10-CM

## 2014-03-07 DIAGNOSIS — M549 Dorsalgia, unspecified: Secondary | ICD-10-CM

## 2014-03-07 DIAGNOSIS — IMO0001 Reserved for inherently not codable concepts without codable children: Secondary | ICD-10-CM

## 2014-03-07 DIAGNOSIS — E1165 Type 2 diabetes mellitus with hyperglycemia: Secondary | ICD-10-CM

## 2014-03-07 DIAGNOSIS — R41840 Attention and concentration deficit: Secondary | ICD-10-CM

## 2014-03-07 DIAGNOSIS — T782XXA Anaphylactic shock, unspecified, initial encounter: Secondary | ICD-10-CM

## 2014-03-07 LAB — POCT GLYCOSYLATED HEMOGLOBIN (HGB A1C): Hemoglobin A1C: 7.6

## 2014-03-07 MED ORDER — DICLOFENAC 35 MG PO CAPS: 1.0000 | ORAL_CAPSULE | Freq: Three times a day (TID) | ORAL | Status: DC

## 2014-03-07 MED ORDER — VENLAFAXINE HCL ER 150 MG PO CP24
150.0000 mg | ORAL_CAPSULE | Freq: Every day | ORAL | Status: DC
Start: 2014-03-07 — End: 2014-07-04

## 2014-03-07 MED ORDER — NAPROXEN-ESOMEPRAZOLE 500-20 MG PO TBEC: 1.0000 | DELAYED_RELEASE_TABLET | Freq: Two times a day (BID) | ORAL | Status: AC

## 2014-03-07 MED ORDER — TRAMADOL HCL 50 MG PO TABS: 50.0000 mg | ORAL_TABLET | Freq: Two times a day (BID) | ORAL | Status: AC | PRN

## 2014-03-07 MED ORDER — AMPHETAMINE-DEXTROAMPHET ER 30 MG PO CP24: 30.0000 mg | ORAL_CAPSULE | ORAL | Status: DC

## 2014-03-07 MED ORDER — CELECOXIB 200 MG PO CAPS: 200.0000 mg | ORAL_CAPSULE | Freq: Every day | ORAL | Status: AC

## 2014-03-07 MED ORDER — EPINEPHRINE 0.3 MG/0.3ML IJ SOAJ: 0.3000 mg | Freq: Once | INTRAMUSCULAR | Status: AC | PRN

## 2014-03-07 MED ORDER — CYCLOBENZAPRINE HCL 10 MG PO TABS: 10.0000 mg | ORAL_TABLET | Freq: Every day | ORAL | Status: DC

## 2014-03-07 NOTE — Patient Instructions (Signed)
1)  Blood Sugar - Your level is much better.  I anticipate it will be even better in 3 - 4 months with increased exercise.    2)  Generalized Pain - Try the anti-inflammatory meds and decide which one works the best for you.  You can add Tylenol 1000 mg up to 3 x per day +/- Flexeril +/- Ultram.    Chronic Pain Chronic pain can be defined as pain that is off and on and lasts for 3 6 months or longer. Many things cause chronic pain, which can make it difficult to make a diagnosis. There are many treatment options available for chronic pain. However, finding a treatment that works well for you may require trying various approaches until the right one is found. Many people benefit from a combination of two or more types of treatment to control their pain. SYMPTOMS  Chronic pain can occur anywhere in the body and can range from mild to very severe. Some types of chronic pain include:  Headache.  Low back pain.  Cancer pain.  Arthritis pain.  Neurogenic pain. This is pain resulting from damage to nerves. People with chronic pain may also have other symptoms such as:  Depression.  Anger.  Insomnia.  Anxiety. DIAGNOSIS  Your health care provider will help diagnose your condition over time. In many cases, the initial focus will be on excluding possible conditions that could be causing the pain. Depending on your symptoms, your health care provider may order tests to diagnose your condition. Some of these tests may include:   Blood tests.   CT scan.   MRI.   X-rays.   Ultrasounds.   Nerve conduction studies.  You may need to see a specialist.  TREATMENT  Finding treatment that works well may take time. You may be referred to a pain specialist. He or she may prescribe medicine or therapies, such as:   Mindful meditation or yoga.  Shots (injections) of numbing or pain-relieving medicines into the spine or area of pain.  Local electrical stimulation.  Acupuncture.    Massage therapy.   Aroma, color, light, or sound therapy.   Biofeedback.   Working with a physical therapist to keep from getting stiff.   Regular, gentle exercise.   Cognitive or behavioral therapy.   Group support.  Sometimes, surgery may be recommended.  HOME CARE INSTRUCTIONS   Take all medicines as directed by your health care provider.   Lessen stress in your life by relaxing and doing things such as listening to calming music.   Exercise or be active as directed by your health care provider.   Eat a healthy diet and include things such as vegetables, fruits, fish, and lean meats in your diet.   Keep all follow-up appointments with your health care provider.   Attend a support group with others suffering from chronic pain. SEEK MEDICAL CARE IF:   Your pain gets worse.   You develop a new pain that was not there before.   You cannot tolerate medicines given to you by your health care provider.   You have new symptoms since your last visit with your health care provider.  SEEK IMMEDIATE MEDICAL CARE IF:   You feel weak.   You have decreased sensation or numbness.   You lose control of bowel or bladder function.   Your pain suddenly gets much worse.   You develop shaking.  You develop chills.  You develop confusion.  You develop chest pain.  You  develop shortness of breath.  MAKE SURE YOU:  Understand these instructions.  Will watch your condition.  Will get help right away if you are not doing well or get worse. Document Released: 08/31/2002 Document Revised: 08/11/2013 Document Reviewed: 06/04/2013 [facility] Patient Information 2014 [place], [place].

## 2014-03-07 NOTE — Progress Notes (Signed)
Subjective:    Patient ID: Danielle BurnetNancy Jane Riley, female    DOB: [birth year], 57 y.o.   MRN: [id number]  HPI  Danielle Riley is here today to discuss the conditions listed below:   1)  ADD -  She is here today to get a refill on her ADD medication (Adderall XR 30 mg).  She says that the medication continues to help her concentrate at work.  She feels that this dosage is appropriate and would like to continue on it.    2)  Mood - She continues taking her Effexor and needs a refill on it.    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    Review of Systems  Constitutional: Positive for appetite change. Negative for fatigue and unexpected weight change.  Cardiovascular: Negative for chest pain and palpitations.  Musculoskeletal: Positive for back pain.  Neurological: Negative for dizziness and headaches.  Psychiatric/Behavioral: Negative for behavioral problems, sleep disturbance, dysphoric mood, decreased concentration and agitation. The patient is not nervous/anxious and is not hyperactive.      Past Medical History  Diagnosis Date  . Hypertension   . Hyperlipidemia   . Diabetes   . ADD (attention deficit disorder)   . Anxiety   . Depression   . Allergic rhinitis   . High risk HPV infection      Past Surgical History  Procedure Laterality Date  . Endometrial biopsy    . Breast reduction surgery    . 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 Depression  Mother   . Diabetes Paternal Grandmother      Current Outpatient Prescriptions on File Prior to Visit  Medication Sig Dispense Refill  . Albiglutide 30 MG PEN Inject 30 mg into the skin once a week.  4 each  5  . aspirin 81 MG tablet Take 81 mg by mouth daily.      . Canagliflozin-Metformin HCl [id number] MG TABS Take 1 tablet by mouth 2 (two) times daily.  60 tablet  5  . FeAsp-FeFum -Suc-C-Thre-B12-FA (MULTIGEN PLUS) 50-101-1 MG TABS Take 1 tablet by mouth 2 (two) times daily.  180 each  3  . Lancets (ONETOUCH ULTRASOFT) lancets       . lisinopril-hydrochlorothiazide (PRINZIDE,ZESTORETIC) 20-25 MG per tablet Take 1 tablet by mouth daily.  90 tablet  1  . omega-3 acid ethyl esters (LOVAZA) 1 G capsule Take 2 capsules (2 g total) by mouth 2 (two) times daily.  120 capsule  11  . ONE TOUCH ULTRA TEST test strip       . progesterone (PROMETRIUM) 100 MG capsule Take 1 capsule (100 mg total) by mouth at bedtime.  90 capsule  3   No current facility-administered medications on file prior to visit.  Allergies  Allergen Reactions  . Lemon Oil Anaphylaxis  . Other Anaphylaxis    pickles  . Advil [Ibuprofen] Swelling  . Peanuts [Peanut Oil] Diarrhea    Reports meds are okay; only allergic to food     Immunization History  Administered Date(s) Administered  . Influenza,inj,Quad PF,36+ Mos 08/27/2013  . Pneumococcal Polysaccharide-23 09/19/2009  . Td 05/13/2003  . Tdap 09/19/2009  . Zoster 04/19/2014       Objective:   Physical Exam  Constitutional: She appears well-nourished. No distress.  Cardiovascular: Normal rate, regular rhythm and normal heart sounds.   Pulmonary/Chest: Effort normal and breath sounds normal.  Neurological: She is alert.  Psychiatric: She has a normal mood and affect. Her behavior is normal. Judgment and thought content normal.      Assessment & Plan:    Danielle Riley was seen today for medication management.  Diagnoses and associated orders for this  visit:  Anxiety state, unspecified - venlafaxine XR (EFFEXOR-XR) 150 MG 24 hr capsule; Take 1 capsule (150 mg total) by mouth daily with breakfast.  Concentration deficit - amphetamine-dextroamphetamine (ADDERALL XR) 30 MG 24 hr capsule; Take 1 capsule (30 mg total) by mouth every morning.  Anaphylaxis - EPINEPHrine (EPIPEN 2-PAK) 0.3 mg/0.3 mL SOAJ injection; Inject 0.3 mLs (0.3 mg total) into the muscle once as needed (for allergies).  Type II or unspecified type diabetes mellitus without mention of complication, uncontrolled - POCT glycosylated hemoglobin (Hb A1C)  Backache - celecoxib (CELEBREX) 200 MG capsule; Take 1 capsule (200 mg total) by mouth daily. - Naproxen-Esomeprazole (VIMOVO) 500-20 MG TBEC; Take 1 tablet by mouth 2 (two) times daily. - Diclofenac (ZORVOLEX) 35 MG CAPS; Take 1 tablet by mouth 3 (three) times daily with meals. - traMADol (ULTRAM) 50 MG tablet; Take 1 tablet (50 mg total) by mouth every 12 (twelve) hours as needed. - cyclobenzaprine (FLEXERIL) 10 MG tablet; Take 1 tablet (10 mg total) by mouth at bedtime.   TIME SPENT "FACE TO FACE" WITH PATIENT -  30 MINS

## 2014-03-21 ENCOUNTER — Other Ambulatory Visit: Payer: Self-pay | Admitting: Family Medicine

## 2014-03-26 ENCOUNTER — Other Ambulatory Visit: Payer: Self-pay | Admitting: Family Medicine

## 2014-03-26 DIAGNOSIS — E785 Hyperlipidemia, unspecified: Secondary | ICD-10-CM

## 2014-04-07 ENCOUNTER — Encounter: Payer: Self-pay | Admitting: Family Medicine

## 2014-04-19 ENCOUNTER — Other Ambulatory Visit: Payer: Self-pay | Admitting: *Deleted

## 2014-04-19 ENCOUNTER — Other Ambulatory Visit (HOSPITAL_COMMUNITY)
Admission: RE | Admit: 2014-04-19 | Discharge: 2014-04-19 | Disposition: A | Discharge status: Home / Self Care | Payer: BC Managed Care – PPO | Source: Ambulatory Visit | Attending: Family Medicine | Admitting: Family Medicine

## 2014-04-19 ENCOUNTER — Encounter (INDEPENDENT_AMBULATORY_CARE_PROVIDER_SITE_OTHER): Payer: Self-pay

## 2014-04-19 ENCOUNTER — Ambulatory Visit (INDEPENDENT_AMBULATORY_CARE_PROVIDER_SITE_OTHER): Payer: BC Managed Care – PPO | Admitting: Family Medicine

## 2014-04-19 ENCOUNTER — Encounter: Payer: Self-pay | Admitting: Family Medicine

## 2014-04-19 VITALS — BP 130/83 | HR 92 | Temp 97.9°F | Resp 16 | Wt 227.0 lb

## 2014-04-19 DIAGNOSIS — Z124 Encounter for screening for malignant neoplasm of cervix: Secondary | ICD-10-CM

## 2014-04-19 DIAGNOSIS — Z1151 Encounter for screening for human papillomavirus (HPV): Secondary | ICD-10-CM | POA: Insufficient documentation

## 2014-04-19 DIAGNOSIS — Z0000 Encounter for general adult medical examination without abnormal findings: Secondary | ICD-10-CM

## 2014-04-19 DIAGNOSIS — R300 Dysuria: Secondary | ICD-10-CM

## 2014-04-19 DIAGNOSIS — N390 Urinary tract infection, site not specified: Secondary | ICD-10-CM

## 2014-04-19 DIAGNOSIS — B9620 Unspecified Escherichia coli [E. coli] as the cause of diseases classified elsewhere: Secondary | ICD-10-CM

## 2014-04-19 DIAGNOSIS — A498 Other bacterial infections of unspecified site: Secondary | ICD-10-CM

## 2014-04-19 DIAGNOSIS — Z01419 Encounter for gynecological examination (general) (routine) without abnormal findings: Secondary | ICD-10-CM | POA: Insufficient documentation

## 2014-04-19 DIAGNOSIS — Z2911 Encounter for prophylactic immunotherapy for respiratory syncytial virus (RSV): Secondary | ICD-10-CM

## 2014-04-19 DIAGNOSIS — Z23 Encounter for immunization: Secondary | ICD-10-CM

## 2014-04-19 LAB — POCT URINALYSIS DIPSTICK
Glucose, UA: 2000
Ketones, UA: 15
Leukocytes, UA: NEGATIVE
Protein, UA: NEGATIVE
Spec Grav, UA: 1.025
Urobilinogen, UA: NEGATIVE
pH, UA: 5

## 2014-04-19 NOTE — Patient Instructions (Signed)
1)  Urine - We are sending off your urine for a culture.  We'll let you know if you grow anything.    2)  Vaccinations - You received your Shingles shot.  We'll give you the Prevnar 13 1-2 months from now.    3)  Colonoscopy/Mammogram/Eye Exam - Get this summer.     Preventive Care for Adults, Female A healthy lifestyle and preventive care can promote health and wellness. Preventive health guidelines for women include the following key practices.  A routine yearly physical is a good way to check with your health care provider about your health and preventive screening. It is a chance to share any concerns and updates on your health and to receive a thorough exam.  Visit your dentist for a routine exam and preventive care every 6 months. Brush your teeth twice a day and floss once a day. Good oral hygiene prevents tooth decay and gum disease.  The frequency of eye exams is based on your age, health, family medical history, use of contact lenses, and other factors. Follow your health care provider's recommendations for frequency of eye exams.  Eat a healthy diet. Foods like vegetables, fruits, whole grains, low-fat dairy products, and lean protein foods contain the nutrients you need without too many calories. Decrease your intake of foods high in solid fats, added sugars, and salt. Eat the right amount of calories for you.Get information about a proper diet from your health care provider, if necessary.  Regular physical exercise is one of the most important things you can do for your health. Most adults should get at least 150 minutes of moderate-intensity exercise (any activity that increases your heart rate and causes you to sweat) each week. In addition, most adults need muscle-strengthening exercises on 2 or more days a week.  Maintain a healthy weight. The body mass index (BMI) is a screening tool to identify possible weight problems. It provides an estimate of body fat based on height and  weight. Your health care provider can find your BMI, and can help you achieve or maintain a healthy weight.For adults 20 years and older:  A BMI below 18.5 is considered underweight.  A BMI of 18.5 to 24.9 is normal.  A BMI of 25 to 29.9 is considered overweight.  A BMI of 30 and above is considered obese.  Maintain normal blood lipids and cholesterol levels by exercising and minimizing your intake of saturated fat. Eat a balanced diet with plenty of fruit and vegetables. Blood tests for lipids and cholesterol should begin at age 64 and be repeated every 5 years. If your lipid or cholesterol levels are high, you are over 50, or you are at high risk for heart disease, you may need your cholesterol levels checked more frequently.Ongoing high lipid and cholesterol levels should be treated with medicines if diet and exercise are not working.  If you smoke, find out from your health care provider how to quit. If you do not use tobacco, do not start.  Lung cancer screening is recommended for adults aged 84 80 years who are at high risk for developing lung cancer because of a history of smoking. A yearly low-dose CT scan of the lungs is recommended for people who have at least a 30-pack-year history of smoking and are a current smoker or have quit within the past 15 years. A pack year of smoking is smoking an average of 1 pack of cigarettes a day for 1 year (for example: 1 pack  a day for 30 years or 2 packs a day for 15 years). Yearly screening should continue until the smoker has stopped smoking for at least 15 years. Yearly screening should be stopped for people who develop a health problem that would prevent them from having lung cancer treatment.  If you are pregnant, do not drink alcohol. If you are breastfeeding, be very cautious about drinking alcohol. If you are not pregnant and choose to drink alcohol, do not have more than 1 drink per day. One drink is considered to be 12 ounces (355 mL) of  beer, 5 ounces (148 mL) of wine, or 1.5 ounces (44 mL) of liquor.  Avoid use of street drugs. Do not share needles with anyone. Ask for help if you need support or instructions about stopping the use of drugs.  High blood pressure causes heart disease and increases the risk of stroke. Your blood pressure should be checked at least every 1 to 2 years. Ongoing high blood pressure should be treated with medicines if weight loss and exercise do not work.  If you are 43 57 years old, ask your health care provider if you should take aspirin to prevent strokes.  Diabetes screening involves taking a blood sample to check your fasting blood sugar level. This should be done once every 3 years, after age 38, if you are within normal weight and without risk factors for diabetes. Testing should be considered at a younger age or be carried out more frequently if you are overweight and have at least 1 risk factor for diabetes.  Breast cancer screening is essential preventive care for women. You should practice "breast self-awareness." This means understanding the normal appearance and feel of your breasts and may include breast self-examination. Any changes detected, no matter how small, should be reported to a health care provider. Women in their 61s and 30s should have a clinical breast exam (CBE) by a health care provider as part of a regular health exam every 1 to 3 years. After age 42, women should have a CBE every year. Starting at age 54, women should consider having a mammogram (breast X-ray test) every year. Women who have a family history of breast cancer should talk to their health care provider about genetic screening. Women at a high risk of breast cancer should talk to their health care providers about having an MRI and a mammogram every year.  Breast cancer gene (BRCA)-related cancer risk assessment is recommended for women who have family members with BRCA-related cancers. BRCA-related cancers include  breast, ovarian, tubal, and peritoneal cancers. Having family members with these cancers may be associated with an increased risk for harmful changes (mutations) in the breast cancer genes BRCA1 and BRCA2. Results of the assessment will determine the need for genetic counseling and BRCA1 and BRCA2 testing.  The Pap test is a screening test for cervical cancer. A Pap test can show cell changes on the cervix that might become cervical cancer if left untreated. A Pap test is a procedure in which cells are obtained and examined from the lower end of the uterus (cervix).  Women should have a Pap test starting at age 38.  Between ages 46 and 54, Pap tests should be repeated every 2 years.  Beginning at age 68, you should have a Pap test every 3 years as long as the past 3 Pap tests have been normal.  Some women have medical problems that increase the chance of getting cervical cancer. Talk to your  health care provider about these problems. It is especially important to talk to your health care provider if a new problem develops soon after your last Pap test. In these cases, your health care provider may recommend more frequent screening and Pap tests.  The above recommendations are the same for women who have or have not gotten the vaccine for human papillomavirus (HPV).  If you had a hysterectomy for a problem that was not cancer or a condition that could lead to cancer, then you no longer need Pap tests. Even if you no longer need a Pap test, a regular exam is a good idea to make sure no other problems are starting.  If you are between ages 30 and 32 years, and you have had normal Pap tests going back 10 years, you no longer need Pap tests. Even if you no longer need a Pap test, a regular exam is a good idea to make sure no other problems are starting.  If you have had past treatment for cervical cancer or a condition that could lead to cancer, you need Pap tests and screening for cancer for at least  20 years after your treatment.  If Pap tests have been discontinued, risk factors (such as a new sexual partner) need to be reassessed to determine if screening should be resumed.  The HPV test is an additional test that may be used for cervical cancer screening. The HPV test looks for the virus that can cause the cell changes on the cervix. The cells collected during the Pap test can be tested for HPV. The HPV test could be used to screen women aged 8 years and older, and should be used in women of any age who have unclear Pap test results. After the age of 57, women should have HPV testing at the same frequency as a Pap test.  Colorectal cancer can be detected and often prevented. Most routine colorectal cancer screening begins at the age of 56 years and continues through age 66 years. However, your health care provider may recommend screening at an earlier age if you have risk factors for colon cancer. On a yearly basis, your health care provider may provide home test kits to check for hidden blood in the stool. Use of a small camera at the end of a tube, to directly examine the colon (sigmoidoscopy or colonoscopy), can detect the earliest forms of colorectal cancer. Talk to your health care provider about this at age 64, when routine screening begins. Direct exam of the colon should be repeated every 5 10 years through age 70 years, unless early forms of pre-cancerous polyps or small growths are found.  People who are at an increased risk for hepatitis B should be screened for this virus. You are considered at high risk for hepatitis B if:  You were born in a country where hepatitis B occurs often. Talk with your health care provider about which countries are considered high risk.  Your parents were born in a high-risk country and you have not received a shot to protect against hepatitis B (hepatitis B vaccine).  You have HIV or AIDS.  You use needles to inject street drugs.  You live with,  or have sex with, someone who has Hepatitis B.  You get hemodialysis treatment.  You take certain medicines for conditions like cancer, organ transplantation, and autoimmune conditions.  Hepatitis C blood testing is recommended for all people born from [birth year] through 1964 and any individual with known risks  for hepatitis C.  Practice safe sex. Use condoms and avoid high-risk sexual practices to reduce the spread of sexually transmitted infections (STIs). STIs include gonorrhea, chlamydia, syphilis, trichomonas, herpes, HPV, and human immunodeficiency virus (HIV). Herpes, HIV, and HPV are viral illnesses that have no cure. They can result in disability, cancer, and death. Sexually active women aged 58 years and younger should be checked for chlamydia. Older women with new or multiple partners should also be tested for chlamydia. Testing for other STIs is recommended if you are sexually active and at increased risk.  Osteoporosis is a disease in which the bones lose minerals and strength with aging. This can result in serious bone fractures or breaks. The risk of osteoporosis can be identified using a bone density scan. Women ages 66 years and over and women at risk for fractures or osteoporosis should discuss screening with their health care providers. Ask your health care provider whether you should take a calcium supplement or vitamin D to reduce the rate of osteoporosis.  Menopause can be associated with physical symptoms and risks. Hormone replacement therapy is available to decrease symptoms and risks. You should talk to your health care provider about whether hormone replacement therapy is right for you.  Use sunscreen. Apply sunscreen liberally and repeatedly throughout the day. You should seek shade when your shadow is shorter than you. Protect yourself by wearing long sleeves, pants, a wide-brimmed hat, and sunglasses year round, whenever you are outdoors.  Once a month, do a whole body skin  exam, using a mirror to look at the skin on your back. Tell your health care provider of new moles, moles that have irregular borders, moles that are larger than a pencil eraser, or moles that have changed in shape or color.  Stay current with required vaccines (immunizations).  Influenza vaccine. All adults should be immunized every year.  Tetanus, diphtheria, and acellular pertussis (Td, Tdap) vaccine. Pregnant women should receive 1 dose of Tdap vaccine during each pregnancy. The dose should be obtained regardless of the length of time since the last dose. Immunization is preferred during the 27th 36th week of gestation. An adult who has not previously received Tdap or who does not know her vaccine status should receive 1 dose of Tdap. This initial dose should be followed by tetanus and diphtheria toxoids (Td) booster doses every 10 years. Adults with an unknown or incomplete history of completing a 3-dose immunization series with Td-containing vaccines should begin or complete a primary immunization series including a Tdap dose. Adults should receive a Td booster every 10 years.  Varicella vaccine. An adult without evidence of immunity to varicella should receive 2 doses or a second dose if she has previously received 1 dose. Pregnant females who do not have evidence of immunity should receive the first dose after pregnancy. This first dose should be obtained before leaving the health care facility. The second dose should be obtained 4 8 weeks after the first dose.  Human papillomavirus (HPV) vaccine. Females aged 34 26 years who have not received the vaccine previously should obtain the 3-dose series. The vaccine is not recommended for use in pregnant females. However, pregnancy testing is not needed before receiving a dose. If a female is found to be pregnant after receiving a dose, no treatment is needed. In that case, the remaining doses should be delayed until after the pregnancy. Immunization is  recommended for any person with an immunocompromised condition through the age of 54 years if she  did not get any or all doses earlier. During the 3-dose series, the second dose should be obtained 4 8 weeks after the first dose. The third dose should be obtained 24 weeks after the first dose and 16 weeks after the second dose.  Zoster vaccine. One dose is recommended for adults aged 9 years or older unless certain conditions are present.  Measles, mumps, and rubella (MMR) vaccine. Adults born before [birth year] generally are considered immune to measles and mumps. Adults born in [birth year] or later should have 1 or more doses of MMR vaccine unless there is a contraindication to the vaccine or there is laboratory evidence of immunity to each of the three diseases. A routine second dose of MMR vaccine should be obtained at least 28 days after the first dose for students attending postsecondary schools, health care workers, or international travelers. People who received inactivated measles vaccine or an unknown type of measles vaccine during 1963 1967 should receive 2 doses of MMR vaccine. People who received inactivated mumps vaccine or an unknown type of mumps vaccine before 1979 and are at high risk for mumps infection should consider immunization with 2 doses of MMR vaccine. For females of childbearing age, rubella immunity should be determined. If there is no evidence of immunity, females who are not pregnant should be vaccinated. If there is no evidence of immunity, females who are pregnant should delay immunization until after pregnancy. Unvaccinated health care workers born before [birth year] who lack laboratory evidence of measles, mumps, or rubella immunity or laboratory confirmation of disease should consider measles and mumps immunization with 2 doses of MMR vaccine or rubella immunization with 1 dose of MMR vaccine.  Pneumococcal 13-valent conjugate (PCV13) vaccine. When indicated, a person who is uncertain of her  immunization history and has no record of immunization should receive the PCV13 vaccine. An adult aged 63 years or older who has certain medical conditions and has not been previously immunized should receive 1 dose of PCV13 vaccine. This PCV13 should be followed with a dose of pneumococcal polysaccharide (PPSV23) vaccine. The PPSV23 vaccine dose should be obtained at least 8 weeks after the dose of PCV13 vaccine. An adult aged 73 years or older who has certain medical conditions and previously received 1 or more doses of PPSV23 vaccine should receive 1 dose of PCV13. The PCV13 vaccine dose should be obtained 1 or more years after the last PPSV23 vaccine dose.  Pneumococcal polysaccharide (PPSV23) vaccine. When PCV13 is also indicated, PCV13 should be obtained first. All adults aged 62 years and older should be immunized. An adult younger than age 63 years who has certain medical conditions should be immunized. Any person who resides in a nursing home or long-term care facility should be immunized. An adult smoker should be immunized. People with an immunocompromised condition and certain other conditions should receive both PCV13 and PPSV23 vaccines. People with human immunodeficiency virus (HIV) infection should be immunized as soon as possible after diagnosis. Immunization during chemotherapy or radiation therapy should be avoided. Routine use of PPSV23 vaccine is not recommended for American Indians, [place] Natives, or people younger than 65 years unless there are medical conditions that require PPSV23 vaccine. When indicated, people who have unknown immunization and have no record of immunization should receive PPSV23 vaccine. One-time revaccination 5 years after the first dose of PPSV23 is recommended for people aged 15 64 years who have chronic kidney failure, nephrotic syndrome, asplenia, or immunocompromised conditions. People who received 1 2 doses of PPSV23  before age 18 years should receive another  dose of PPSV23 vaccine at age 73 years or later if at least 5 years have passed since the previous dose. Doses of PPSV23 are not needed for people immunized with PPSV23 at or after age 65 years.  Meningococcal vaccine. Adults with asplenia or persistent complement component deficiencies should receive 2 doses of quadrivalent meningococcal conjugate (MenACWY-D) vaccine. The doses should be obtained at least 2 months apart. Microbiologists working with certain meningococcal bacteria, [employer] recruits, people at risk during an outbreak, and people who travel to or live in countries with a high rate of meningitis should be immunized. A first-year college student up through age 27 years who is living in a residence hall should receive a dose if she did not receive a dose on or after her 16th birthday. Adults who have certain high-risk conditions should receive one or more doses of vaccine.  Hepatitis A vaccine. Adults who wish to be protected from this disease, have certain high-risk conditions, work with hepatitis A-infected animals, work in hepatitis A research labs, or travel to or work in countries with a high rate of hepatitis A should be immunized. Adults who were previously unvaccinated and who anticipate close contact with an international adoptee during the first 60 days after arrival in the [place] from a country with a high rate of hepatitis A should be immunized.  Hepatitis B vaccine. Adults who wish to be protected from this disease, have certain high-risk conditions, may be exposed to blood or other infectious body fluids, are household contacts or sex partners of hepatitis B positive people, are clients or workers in certain care facilities, or travel to or work in countries with a high rate of hepatitis B should be immunized.  Haemophilus influenzae type b (Hib) vaccine. A previously unvaccinated person with asplenia or sickle cell disease or having a scheduled splenectomy should receive 1  dose of Hib vaccine. Regardless of previous immunization, a recipient of a hematopoietic stem cell transplant should receive a 3-dose series 6 12 months after her successful transplant. Hib vaccine is not recommended for adults with HIV infection. Preventive Services / Frequency Ages 77 to 39years  Blood pressure check.** / Every 1 to 2 years.  Lipid and cholesterol check.** / Every 5 years beginning at age 65.  Clinical breast exam.** / Every 3 years for women in their 19s and 30s.  BRCA-related cancer risk assessment.** / For women who have family members with a BRCA-related cancer (breast, ovarian, tubal, or peritoneal cancers).  Pap test.** / Every 2 years from ages 65 through 59. Every 3 years starting at age 74 through age 42 or 99 with a history of 3 consecutive normal Pap tests.  HPV screening.** / Every 3 years from ages 80 through ages 50 to 59 with a history of 3 consecutive normal Pap tests.  Hepatitis C blood test.** / For any individual with known risks for hepatitis C.  Skin self-exam. / Monthly.  Influenza vaccine. / Every year.  Tetanus, diphtheria, and acellular pertussis (Tdap, Td) vaccine.** / Consult your health care provider. Pregnant women should receive 1 dose of Tdap vaccine during each pregnancy. 1 dose of Td every 10 years.  Varicella vaccine.** / Consult your health care provider. Pregnant females who do not have evidence of immunity should receive the first dose after pregnancy.  HPV vaccine. / 3 doses over 6 months, if 11 and younger. The vaccine is not recommended for use in pregnant females. However,  pregnancy testing is not needed before receiving a dose.  Measles, mumps, rubella (MMR) vaccine.** / You need at least 1 dose of MMR if you were born in 1957 or later. You may also need a 2nd dose. For females of childbearing age, rubella immunity should be determined. If there is no evidence of immunity, females who are not pregnant should be vaccinated. If  there is no evidence of immunity, females who are pregnant should delay immunization until after pregnancy.  Pneumococcal 13-valent conjugate (PCV13) vaccine.** / Consult your health care provider.  Pneumococcal polysaccharide (PPSV23) vaccine.** / 1 to 2 doses if you smoke cigarettes or if you have certain conditions.  Meningococcal vaccine.** / 1 dose if you are age 71 to 19 years and a Market researcher living in a residence hall, or have one of several medical conditions, you need to get vaccinated against meningococcal disease. You may also need additional booster doses.  Hepatitis A vaccine.** / Consult your health care provider.  Hepatitis B vaccine.** / Consult your health care provider.  Haemophilus influenzae type b (Hib) vaccine.** / Consult your health care provider. Ages 42 to 64years  Blood pressure check.** / Every 1 to 2 years.  Lipid and cholesterol check.** / Every 5 years beginning at age 63 years.  Lung cancer screening. / Every year if you are aged 17 80 years and have a 30-pack-year history of smoking and currently smoke or have quit within the past 15 years. Yearly screening is stopped once you have quit smoking for at least 15 years or develop a health problem that would prevent you from having lung cancer treatment.  Clinical breast exam.** / Every year after age 42 years.  BRCA-related cancer risk assessment.** / For women who have family members with a BRCA-related cancer (breast, ovarian, tubal, or peritoneal cancers).  Mammogram.** / Every year beginning at age 68 years and continuing for as long as you are in good health. Consult with your health care provider.  Pap test.** / Every 3 years starting at age 57 years through age 109 or 23 years with a history of 3 consecutive normal Pap tests.  HPV screening.** / Every 3 years from ages 71 years through ages 43 to 49 years with a history of 3 consecutive normal Pap tests.  Fecal occult blood test  (FOBT) of stool. / Every year beginning at age 58 years and continuing until age 59 years. You may not need to do this test if you get a colonoscopy every 10 years.  Flexible sigmoidoscopy or colonoscopy.** / Every 5 years for a flexible sigmoidoscopy or every 10 years for a colonoscopy beginning at age 51 years and continuing until age 42 years.  Hepatitis C blood test.** / For all people born from [birth year] through 1964 and any individual with known risks for hepatitis C.  Skin self-exam. / Monthly.  Influenza vaccine. / Every year.  Tetanus, diphtheria, and acellular pertussis (Tdap/Td) vaccine.** / Consult your health care provider. Pregnant women should receive 1 dose of Tdap vaccine during each pregnancy. 1 dose of Td every 10 years.  Varicella vaccine.** / Consult your health care provider. Pregnant females who do not have evidence of immunity should receive the first dose after pregnancy.  Zoster vaccine.** / 1 dose for adults aged 28 years or older.  Measles, mumps, rubella (MMR) vaccine.** / You need at least 1 dose of MMR if you were born in 1956 or later. You may also need a 2nd dose. For  females of childbearing age, rubella immunity should be determined. If there is no evidence of immunity, females who are not pregnant should be vaccinated. If there is no evidence of immunity, females who are pregnant should delay immunization until after pregnancy.  Pneumococcal 13-valent conjugate (PCV13) vaccine.** / Consult your health care provider.  Pneumococcal polysaccharide (PPSV23) vaccine.** / 1 to 2 doses if you smoke cigarettes or if you have certain conditions.  Meningococcal vaccine.** / Consult your health care provider.  Hepatitis A vaccine.** / Consult your health care provider.  Hepatitis B vaccine.** / Consult your health care provider.  Haemophilus influenzae type b (Hib) vaccine.** / Consult your health care provider. Ages 90 years and over  Blood pressure check.** / Every  1 to 2 years.  Lipid and cholesterol check.** / Every 5 years beginning at age 55 years.  Lung cancer screening. / Every year if you are aged 7 80 years and have a 30-pack-year history of smoking and currently smoke or have quit within the past 15 years. Yearly screening is stopped once you have quit smoking for at least 15 years or develop a health problem that would prevent you from having lung cancer treatment.  Clinical breast exam.** / Every year after age 50 years.  BRCA-related cancer risk assessment.** / For women who have family members with a BRCA-related cancer (breast, ovarian, tubal, or peritoneal cancers).  Mammogram.** / Every year beginning at age 62 years and continuing for as long as you are in good health. Consult with your health care provider.  Pap test.** / Every 3 years starting at age 3 years through age 61 or 69 years with 3 consecutive normal Pap tests. Testing can be stopped between 65 and 70 years with 3 consecutive normal Pap tests and no abnormal Pap or HPV tests in the past 10 years.  HPV screening.** / Every 3 years from ages 75 years through ages 29 or 40 years with a history of 3 consecutive normal Pap tests. Testing can be stopped between 65 and 70 years with 3 consecutive normal Pap tests and no abnormal Pap or HPV tests in the past 10 years.  Fecal occult blood test (FOBT) of stool. / Every year beginning at age 54 years and continuing until age 5 years. You may not need to do this test if you get a colonoscopy every 10 years.  Flexible sigmoidoscopy or colonoscopy.** / Every 5 years for a flexible sigmoidoscopy or every 10 years for a colonoscopy beginning at age 8 years and continuing until age 95 years.  Hepatitis C blood test.** / For all people born from [birth year] through 1964 and any individual with known risks for hepatitis C.  Osteoporosis screening.** / A one-time screening for women ages 75 years and over and women at risk for fractures or  osteoporosis.  Skin self-exam. / Monthly.  Influenza vaccine. / Every year.  Tetanus, diphtheria, and acellular pertussis (Tdap/Td) vaccine.** / 1 dose of Td every 10 years.  Varicella vaccine.** / Consult your health care provider.  Zoster vaccine.** / 1 dose for adults aged 24 years or older.  Pneumococcal 13-valent conjugate (PCV13) vaccine.** / Consult your health care provider.  Pneumococcal polysaccharide (PPSV23) vaccine.** / 1 dose for all adults aged 55 years and older.  Meningococcal vaccine.** / Consult your health care provider.  Hepatitis A vaccine.** / Consult your health care provider.  Hepatitis B vaccine.** / Consult your health care provider.  Haemophilus influenzae type b (Hib) vaccine.** / Consult your  health care provider. ** Family history and personal history of risk and conditions may change your health care provider's recommendations. Document Released: 02/04/2002 Document Revised: 09/29/2013 Document Reviewed: 05/06/2011 [facility] Patient Information 2014 [place], [place].

## 2014-04-19 NOTE — Progress Notes (Signed)
 Subjective:    Patient ID: Danielle Riley, female    DOB: 01/16/1956, 57 y.o.   MRN: [id number]  HPI  Danielle Riley is here for CPE/PAP.  She also wants to discuss the condition listed below:   1)  Dysuria - She has been having burning sensation with urination for the past three weeks.  She felt that this problem was caused by her Invoka-met and started using Monistat cream which relieved some of her vaginal irritation.     Review of Systems  Constitutional: Negative for activity change, appetite change, fatigue and unexpected weight change.  HENT: Negative for congestion, dental problem, ear pain, hearing loss, trouble swallowing and voice change.   Eyes: Negative for pain, redness and visual disturbance.  Respiratory: Negative for cough and shortness of breath.   Cardiovascular: Negative for chest pain, palpitations and leg swelling.  Gastrointestinal: Negative for nausea, vomiting, abdominal pain, diarrhea, constipation and blood in stool.  Endocrine: Negative for cold intolerance, heat intolerance, polydipsia, polyphagia and polyuria.  Genitourinary: Negative for dysuria, urgency, frequency, hematuria, vaginal discharge and pelvic pain.       Burning with urination   Musculoskeletal: Negative for arthralgias, back pain, joint swelling, myalgias and neck pain.  Skin: Negative for rash.  Neurological: Negative for dizziness, weakness and headaches.  Hematological: Negative for adenopathy. Does not bruise/bleed easily.  Psychiatric/Behavioral: Negative for sleep disturbance, dysphoric mood and decreased concentration. The patient is not nervous/anxious.      Past Medical History  Diagnosis Date  . Hypertension   . Hyperlipidemia   . Diabetes   . ADD (attention deficit disorder)   . Anxiety   . Depression   . Allergic rhinitis   . High risk HPV infection      Past Surgical History  Procedure Laterality Date  . Endometrial biopsy    . Breast reduction surgery    . 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 Depression Mother   . Diabetes Paternal Grandmother      Current Outpatient Prescriptions on File Prior to Visit  Medication Sig Dispense Refill  . Albiglutide 30 MG PEN Inject 30 mg into the skin once a week.  4 each  5  . amphetamine-dextroamphetamine (ADDERALL XR) 30 MG 24 hr capsule Take 1 capsule (30 mg total) by mouth every morning.  90 capsule  0  . aspirin 81 MG tablet Take 81 mg by mouth daily.      . celecoxib (CELEBREX) 200 MG capsule Take 1 capsule (200 mg total) by mouth daily.  30 capsule  11  . cyclobenzaprine (FLEXERIL) 10 MG tablet Take 1 tablet (10 mg total) by mouth at bedtime.  30 tablet  5  . EPINEPHrine (EPIPEN 2-PAK) 0.3 mg/0.3 mL SOAJ injection Inject 0.3 mLs (0.3 mg   total) into the muscle once as needed (for allergies).  2 Device  11  . FeAsp-FeFum -Suc-C-Thre-B12-FA (MULTIGEN PLUS) 50-101-1 MG TABS Take 1 tablet by mouth 2 (two) times daily.  180 each  3  . Lancets (ONETOUCH ULTRASOFT) lancets       . lisinopril-hydrochlorothiazide (PRINZIDE,ZESTORETIC) 20-25 MG per tablet Take 1 tablet by mouth daily.  90 tablet  1  . omega-3 acid ethyl esters (LOVAZA) 1 G capsule Take 2 capsules (2 g total) by mouth 2 (two) times daily.  120 capsule  11  . ONE TOUCH ULTRA TEST test strip       . pravastatin (PRAVACHOL) 40 MG tablet TAKE 1 TABLET BY MOUTH EVERY NIGHT AT BEDTIME  90 tablet  3  . progesterone (PROMETRIUM) 100 MG  capsule Take 1 capsule (100 mg total) by mouth at bedtime.  90 capsule  3  . venlafaxine XR (EFFEXOR-XR) 150 MG 24 hr capsule Take 1 capsule (150 mg total) by mouth daily with breakfast.  90 capsule  1  . Canagliflozin-Metformin HCl 150-1000 MG TABS Take 1 tablet by mouth 2 (two) times daily.  60 tablet  5  . Diclofenac (ZORVOLEX) 35 MG CAPS Take 1 tablet by mouth 3 (three) times daily with meals.  90 capsule  11  . Naproxen-Esomeprazole (VIMOVO) 500-20 MG TBEC Take 1 tablet by mouth 2 (two) times daily.  60 tablet  11  . traMADol (ULTRAM) 50 MG tablet Take 1 tablet (50 mg total) by mouth every 12 (twelve) hours as needed.  60 tablet  5   No current facility-administered medications on file prior to visit.     Allergies  Allergen Reactions  . Lemon Oil Anaphylaxis  . Other Anaphylaxis    pickles  . Advil [Ibuprofen] Swelling  . Peanuts [Peanut Oil] Diarrhea    Reports meds are okay; only allergic to food     Immunization History  Administered Date(s) Administered  . Influenza,inj,Quad PF,36+ Mos 08/27/2013  . Pneumococcal Polysaccharide-23 09/19/2009  . Td 05/13/2003  . Tdap 09/19/2009  . Zoster 04/19/2014       Objective:   Physical Exam  Constitutional: She is oriented to person, place, and time. She appears well-developed and well-nourished.  HENT:  Head: Normocephalic and atraumatic.  Right Ear: External ear normal.  Left Ear: External ear normal.  Nose: Nose normal.  Mouth/Throat: Oropharynx is clear and moist.  Eyes: Conjunctivae and EOM are normal. Pupils are equal, round, and reactive to light.  Neck: Normal range of motion. No thyromegaly present.  Cardiovascular: Normal rate, regular rhythm, normal heart sounds and intact distal pulses.  Exam reveals no gallop and no friction rub.   No murmur heard. Pulmonary/Chest: Effort normal and breath sounds normal. Right breast exhibits no inverted nipple, no mass, no nipple discharge, no skin change and no tenderness.  Left breast exhibits no inverted nipple, no mass, no nipple discharge, no skin change and no tenderness. Breasts are symmetrical.    Abdominal: Soft. Bowel sounds are normal. Hernia confirmed negative in the right inguinal area and confirmed negative in the left inguinal area.  Genitourinary: Vagina normal and uterus normal. Pelvic exam was performed with patient supine. There is no rash, tenderness or lesion on the right labia. There is no rash, tenderness or lesion on the left labia. No vaginal discharge found.  Musculoskeletal: Normal range of motion. She exhibits no edema and no tenderness.  Lymphadenopathy:    She has no cervical adenopathy.         Right: No inguinal adenopathy present.       Left: No inguinal adenopathy present.  Neurological: She is alert and oriented to person, place, and time. She has normal reflexes.  Skin: Skin is warm and dry.  Psychiatric: She has a normal mood and affect. Her behavior is normal. Judgment and thought content normal.      Assessment & Plan:    Rheannon was seen today for annual exam.  Diagnoses and associated orders for this visit:  Routine general medical examination at a health care facility - POCT urinalysis dipstick  Screening for malignant neoplasm of the cervix - Cytology - PAP  Need for prophylactic vaccination and inoculation against other viral diseases(V04.89)  - Varicella-zoster vaccine subcutaneous  Dysuria - Urine Culture (>100,00 E. Coli)

## 2014-04-21 LAB — URINE CULTURE: Colony Count: >=100000

## 2014-04-24 MED ORDER — AMOXICILLIN-POT CLAVULANATE 875-125 MG PO TABS: 1.0000 | ORAL_TABLET | Freq: Two times a day (BID) | ORAL | Status: AC

## 2014-06-29 LAB — HM DIABETES EYE EXAM

## 2014-07-02 ENCOUNTER — Other Ambulatory Visit: Payer: Self-pay | Admitting: Family Medicine

## 2014-07-04 ENCOUNTER — Other Ambulatory Visit: Payer: Self-pay | Admitting: *Deleted

## 2014-07-04 ENCOUNTER — Encounter: Payer: Self-pay | Admitting: *Deleted

## 2014-07-04 DIAGNOSIS — F411 Generalized anxiety disorder: Secondary | ICD-10-CM

## 2014-07-04 MED ORDER — VENLAFAXINE HCL ER 150 MG PO CP24: 150.0000 mg | ORAL_CAPSULE | Freq: Every day | ORAL | Status: DC

## 2014-07-04 NOTE — Telephone Encounter (Signed)
[facility] Tan[facility] .Let me know if she needs appt.

## 2014-07-07 ENCOUNTER — Encounter: Payer: Self-pay | Admitting: Family Medicine

## 2014-07-07 ENCOUNTER — Ambulatory Visit (INDEPENDENT_AMBULATORY_CARE_PROVIDER_SITE_OTHER): Payer: BC Managed Care – PPO | Admitting: Family Medicine

## 2014-07-07 VITALS — BP 133/79 | HR 95 | Resp 16 | Ht 65.0 in | Wt 228.0 lb

## 2014-07-07 DIAGNOSIS — F3289 Other specified depressive episodes: Secondary | ICD-10-CM

## 2014-07-07 DIAGNOSIS — N951 Menopausal and female climacteric states: Secondary | ICD-10-CM

## 2014-07-07 DIAGNOSIS — R52 Pain, unspecified: Secondary | ICD-10-CM

## 2014-07-07 DIAGNOSIS — G4700 Insomnia, unspecified: Secondary | ICD-10-CM

## 2014-07-07 DIAGNOSIS — F329 Major depressive disorder, single episode, unspecified: Secondary | ICD-10-CM

## 2014-07-07 DIAGNOSIS — IMO0001 Reserved for inherently not codable concepts without codable children: Secondary | ICD-10-CM

## 2014-07-07 DIAGNOSIS — E1165 Type 2 diabetes mellitus with hyperglycemia: Principal | ICD-10-CM

## 2014-07-07 DIAGNOSIS — F32A Depression, unspecified: Secondary | ICD-10-CM

## 2014-07-07 DIAGNOSIS — R41840 Attention and concentration deficit: Secondary | ICD-10-CM

## 2014-07-07 DIAGNOSIS — D509 Iron deficiency anemia, unspecified: Secondary | ICD-10-CM

## 2014-07-07 DIAGNOSIS — E781 Pure hyperglyceridemia: Secondary | ICD-10-CM

## 2014-07-07 LAB — POCT GLYCOSYLATED HEMOGLOBIN (HGB A1C): Hemoglobin A1C: 7.4

## 2014-07-07 MED ORDER — AMPHETAMINE-DEXTROAMPHET ER 30 MG PO CP24: 30.0000 mg | ORAL_CAPSULE | ORAL | Status: DC

## 2014-07-07 MED ORDER — PROGESTERONE MICRONIZED 100 MG PO CAPS: 100.0000 mg | ORAL_CAPSULE | Freq: Every day | ORAL | Status: DC

## 2014-07-07 MED ORDER — DULAGLUTIDE 1.5 MG/0.5ML ~~LOC~~ SOAJ: 1.0000 "pen " | SUBCUTANEOUS | Status: AC

## 2014-07-07 MED ORDER — OMEGA-3-ACID ETHYL ESTERS 1 G PO CAPS: 2.0000 g | ORAL_CAPSULE | Freq: Two times a day (BID) | ORAL | Status: DC

## 2014-07-07 MED ORDER — GLUCOSE BLOOD VI STRP: ORAL_STRIP | Status: AC

## 2014-07-07 MED ORDER — CANAGLIFLOZIN-METFORMIN HCL 150-1000 MG PO TABS: 1.0000 | ORAL_TABLET | Freq: Two times a day (BID) | ORAL | Status: AC

## 2014-07-07 MED ORDER — MULTIGEN PLUS 151-60-1 MG PO TABS: 1.0000 | ORAL_TABLET | Freq: Two times a day (BID) | ORAL | Status: AC

## 2014-07-07 MED ORDER — DULOXETINE HCL 60 MG PO CPEP: 60.0000 mg | ORAL_CAPSULE | Freq: Every day | ORAL | Status: DC

## 2014-07-07 NOTE — Patient Instructions (Signed)
1)  Mood - Alternate the Cymbalta 60 mg with the Effexor 150 every other day for one month.  Then do Cymbalta 2 days then 1 Effexor for the next month the go to Cymbalta daily.  Set [facility] an appointment with [facility] (Dr. Wyn Quakerew).    2)  Pain - The Cymbalta will help and take the Celebrex daily.  Increase your exercise.   3)  Blood Sugar - Take the Invokamet twice a day and Trulicity weekly. You will start on the .75 for 2 weeks then go to the 1.5.    4)  Sleep - Increase the Prometrium to 200 mg at night.
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
## 2014-07-15 ENCOUNTER — Other Ambulatory Visit: Payer: Self-pay | Admitting: Family Medicine

## 2014-07-15 DIAGNOSIS — N951 Menopausal and female climacteric states: Secondary | ICD-10-CM

## 2014-07-15 DIAGNOSIS — G4700 Insomnia, unspecified: Secondary | ICD-10-CM

## 2014-07-15 MED ORDER — PROGESTERONE MICRONIZED 200 MG PO CAPS: 200.0000 mg | ORAL_CAPSULE | Freq: Every day | ORAL | Status: DC

## 2014-08-18 DIAGNOSIS — IMO0001 Reserved for inherently not codable concepts without codable children: Secondary | ICD-10-CM | POA: Insufficient documentation

## 2014-08-18 DIAGNOSIS — E1165 Type 2 diabetes mellitus with hyperglycemia: Principal | ICD-10-CM

## 2014-08-18 DIAGNOSIS — F329 Major depressive disorder, single episode, unspecified: Secondary | ICD-10-CM | POA: Insufficient documentation

## 2014-08-18 DIAGNOSIS — R52 Pain, unspecified: Secondary | ICD-10-CM | POA: Insufficient documentation

## 2014-08-18 DIAGNOSIS — F32A Depression, unspecified: Secondary | ICD-10-CM | POA: Insufficient documentation

## 2014-10-29 DIAGNOSIS — E1165 Type 2 diabetes mellitus with hyperglycemia: Secondary | ICD-10-CM | POA: Insufficient documentation

## 2014-10-29 DIAGNOSIS — F909 Attention-deficit hyperactivity disorder, unspecified type: Secondary | ICD-10-CM | POA: Insufficient documentation

## 2015-08-03 DIAGNOSIS — Z1211 Encounter for screening for malignant neoplasm of colon: Secondary | ICD-10-CM | POA: Insufficient documentation

## 2016-07-23 DIAGNOSIS — M1611 Unilateral primary osteoarthritis, right hip: Secondary | ICD-10-CM | POA: Diagnosis present

## 2016-07-23 NOTE — H&P (Signed)
PREOPERATIVE H&P Patient ID: Danielle Riley MRN: [id number] DOB/AGE: 59-Jun-1957 59 y.o.  Chief Complaint: OA RIGHT HIP  Planned Procedure Date: 08/13/16 Medical Clearance by Dr. Alberteen Sam is pending    HPI: Danielle Riley is a 60 y.o. female with a history of ADD, DM, HTN, HLD, and depression who presents for evaluation of OA RIGHT HIP. The patient has a history of pain and functional disability in the right hip due to arthritis and has failed non-surgical conservative treatments for greater than 12 weeks to include NSAID's and/or analgesics, corticosteriod injections and activity modification.  Also chiropractor, and PT.  Onset of symptoms was gradual, starting 2 years ago with gradually worsening course since that time.  Patient currently rates pain at 10 out of 10 with activity. Patient has night pain, worsening of pain with activity and weight bearing and pain that interferes with activities of daily living.  X-rays of the right hip and AP pelvis show severe bone on bone osteoarthritis of the right hip.  There is some early acetabulum cystic change as well.  There is no active infection.  Past Medical History:  Diagnosis Date  . ADD (attention deficit disorder)   . Allergic rhinitis   . Anxiety   . Depression   . Diabetes   . High risk HPV infection   . Hyperlipidemia   . Hypertension    Past Surgical History:  Procedure Laterality Date  . BREAST REDUCTION SURGERY    . ENDOMETRIAL BIOPSY    . OTHER SURGICAL HISTORY     ECC   Allergies  Allergen Reactions  . Lemon Oil Anaphylaxis  . Other Anaphylaxis    pickles  . Advil [Ibuprofen] Swelling  . Peanuts [Peanut Oil] Diarrhea    Reports meds are okay; only allergic to food   Prior to Admission medications   Medication Sig Start Date End Date Taking? Authorizing Provider  amphetamine-dextroamphetamine (ADDERALL XR) 30 MG 24 hr capsule Take 1 capsule (30 mg total) by mouth every morning. 07/07/14 07/07/15  Gillian Scarce, MD   aspirin 81 MG tablet Take 81 mg by mouth daily.    Historical Provider, MD  cetirizine (ZYRTEC) 10 MG tablet Take 10 mg by mouth daily.    Historical Provider, MD  DULoxetine (CYMBALTA) 60 MG capsule Take 1 capsule (60 mg total) by mouth daily. 07/07/14 07/08/15  Gillian Scarce, MD  EPINEPHrine (EPIPEN 2-PAK) 0.3 mg/0.3 mL SOAJ injection Inject 0.3 mLs (0.3 mg total) into the muscle once as needed (for allergies). 03/07/14   Gillian Scarce, MD  glucose blood (ONE TOUCH ULTRA TEST) test strip Check sugar daily 07/07/14   Gillian Scarce, MD  Lancets (ONETOUCH ULTRASOFT) lancets  03/02/13   Historical Provider, MD  lisinopril-hydrochlorothiazide (PRINZIDE,ZESTORETIC) 20-25 MG per tablet Take 1 tablet by mouth daily. 11/26/13 11/26/14  Gillian Scarce, MD  omega-3 acid ethyl esters (LOVAZA) 1 G capsule Take 2 capsules (2 g total) by mouth 2 (two) times daily. 07/07/14 07/07/15  Gillian Scarce, MD  pravastatin (PRAVACHOL) 40 MG tablet TAKE 1 TABLET BY MOUTH EVERY NIGHT AT BEDTIME    Gillian Scarce, MD  progesterone (PROMETRIUM) 100 MG capsule Take 1 capsule (100 mg total) by mouth at bedtime. 07/07/14   Gillian Scarce, MD  progesterone (PROMETRIUM) 200 MG capsule Take 1 capsule (200 mg total) by mouth at bedtime. 07/15/14   Gillian Scarce, MD  venlafaxine XR (EFFEXOR-XR) 150 MG 24 hr capsule Take 1 capsule (150 mg  total) by mouth daily with breakfast. 07/04/14 07/04/15  Gillian Scarce, MD   Social History   Social History  . Marital status: Single    Spouse name: N/A  . Number of children: 0  . Years of education: 19   Occupational History  . TEACHER  [employer]   Social History Main Topics  . Smoking status: Former Smoker    Types: Cigarettes    Quit date: 06/13/1990  . Smokeless tobacco: Never Used  . Alcohol use No  . Drug use: No  . Sexual activity: No   Other Topics Concern  . 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 Depression Mother   . Diabetes Paternal Grandmother     ROS: Currently denies lightheadedness, dizziness, Fever, chills, CP, SOB.   No personal history of DVT, PE, MI, or CVA. No loose teeth or dentures.  One recent lower tooth loss. All other systems have been reviewed and were otherwise negative with the exception of those mentioned in the HPI and as above.  Objective: Vitals: Ht: 5'6" Wt: 213 Temp: 98 BP: 119/74 Pulse: 109 O2 97% on room air. Physical Exam: General: Alert, NAD.  Trendelenberg Gait  HEENT: EOMI, Good Neck Extension  Pulm: No increased work of breathing.  Clear B/L A/P w/o crackle or wheeze.  CV: RRR, No m/g/r appreciated  GI: soft, NT, ND Neuro: Neuro grossly intact b/l upper/lower ext.  Sensation intact distally Skin: No lesions in the area of chief complaint MSK/Surgical Site: Right Hip Non tender over greater trochanter.  Pain with passive ROM.  Positive Stinchfield.  5/5 strength.  NVI.  Sensation intact distally.   Imaging Review X-rays of the right hip and AP pelvis show severe bone on bone osteoarthritis of the right hip.  There is some early acetabulum cystic change as well.    Assessment: OA RIGHT HIP Principal Problem:   Primary osteoarthritis of right hip Active Problems:   Dysuria   Concentration deficit   Anxiety state   Diabetes (HCC)   Anemia   Menopause syndrome   Insomnia   High triglycerides   Essential hypertension, benign   Depression   Plan: Plan for Procedure(s): TOTAL HIP ARTHROPLASTY ANTERIOR  APPROACH  The patient history, physical exam, clinical judgement of the provider and imaging are consistent with end stage degenerative joint disease and total joint arthroplasty is deemed medically necessary. The treatment options including medical management, injection therapy, and arthroplasty were discussed at length. The risks and benefits of Procedure(s): TOTAL HIP ARTHROPLASTY ANTERIOR APPROACH were presented and reviewed.  The risks of nonoperative treatment, versus surgical intervention including but not limited to continued pain, aseptic loosening, stiffness, dislocation/subluxation, infection, bleeding, nerve injury, blood clots, cardiopulmonary complications, morbidity, mortality, among others were discussed. The patient verbalizes understanding and wishes to proceed with the plan.  Patient is being admitted  for inpatient treatment for surgery, pain control, PT, OT, prophylactic antibiotics, VTE prophylaxis, progressive ambulation, ADL's and discharge planning.   Dental prophylaxis discussed and recommended for 2 years postoperatively.  The patient does meet the criteria for TXA which will be used perioperatively via IV.   ASA 325 mg will be used postoperatively for DVT prophylaxis in addition to SCDs, and early ambulation. The patient is planning to be discharged home with home health services in care of her sister Darl Pikes.  Lucretia Kern Martensen III,PA-C 07/23/2016 8:16 AM

## 2016-08-01 NOTE — Pre-Procedure Instructions (Signed)
Danielle Riley  08/01/2016      [facility] [address] [place] - [address] [place] [address] Phone: [phone] Fax: [phone]    Your procedure is scheduled on  Tuesday 08/13/16  Report to [facility] Admitting at 530 A.M.  Call this number if you have problems the morning of surgery:  [phone]   Remember:  Do not eat food or drink liquids after midnight.   Take these medicines the morning of surgery with A SIP OF WATER   DULOXETINE (CYMBALTA)   (STOP 7 DAYS PRIOR TO SURGERY CELEBREX (CELECOXIB), ASPIRIN OR ASPIRIN PRODUCTS, IBUPROFEN/ MOTRIN/ ADVIL, GOODY POWDERS, BC'S, HERBAL MEDICINES.      How to Manage Your Diabetes Before and After Surgery  Why is it important to control my blood sugar before and after surgery? . Improving blood sugar levels before and after surgery helps healing and can limit problems. . A way of improving blood sugar control is eating a healthy diet by: o  Eating less sugar and carbohydrates o  Increasing activity/exercise o  Talking with your doctor about reaching your blood sugar goals . High blood sugars (greater than 180 mg/dL) can raise your risk of infections and slow your recovery, so you will need to focus on controlling your diabetes during the weeks before surgery. . Make sure that the doctor who takes care of your diabetes knows about your planned surgery including the date and location.  How do I manage my blood sugar before surgery? . Check your blood sugar at least 4 times a day, starting 2 days before surgery, to make sure that the level is not too high or low. o Check your blood sugar the morning of your surgery when you wake up and every 2 hours until you get to the Short Stay unit. . If your blood sugar is less than 70 mg/dL, you will need to treat for low blood sugar: o Do not take insulin. o Treat a low blood sugar (less than 70 mg/dL) with  cup of clear  juice (cranberry or apple), 4 glucose tablets, OR glucose gel. o Recheck blood sugar in 15 minutes after treatment (to make sure it is greater than 70 mg/dL). If your blood sugar is not greater than 70 mg/dL on recheck, call [phone] for further instructions. . Report your blood sugar to the short stay nurse when you get to Short Stay.  . If you are admitted to the hospital after surgery: o Your blood sugar will be checked by the staff and you will probably be given insulin after surgery (instead of oral diabetes medicines) to make sure you have good blood sugar levels. o The goal for blood sugar control after surgery is 80-180 mg/dL.              WHAT DO I DO ABOUT MY DIABETES MEDICATION?   Marland Kitchen Do not take oral diabetes medicines (pills) the morning of surgery.       . The day of surgery, do not take other diabetes injectables, including Byetta (exenatide), Bydureon (exenatide ER), Victoza (liraglutide), or Trulicity (dulaglutide).         Do not wear jewelry, make-up or nail polish.  Do not wear lotions, powders, or perfumes.  You may wear deoderant.  Do not shave 48 hours prior to surgery.  Men may shave face and neck.  Do not bring valuables to the hospital.  [facility]  is not responsible for any belongings or valuables.  Contacts, dentures or bridgework may not be worn into surgery.  Leave your suitcase in the car.  After surgery it may be brought to your room.  For patients admitted to the hospital, discharge time will be determined by your treatment team.    Special instructions:  SEE PREPARING FOR SURGERY  Please read over the following fact sheets that you were given. Pain Booklet, MRSA Information and Surgical Site Infection Prevention

## 2016-08-02 ENCOUNTER — Encounter (HOSPITAL_COMMUNITY): Payer: Self-pay

## 2016-08-02 ENCOUNTER — Encounter (HOSPITAL_COMMUNITY)
Admission: RE | Admit: 2016-08-02 | Discharge: 2016-08-02 | Disposition: A | Discharge status: Home / Self Care | Payer: BC Managed Care – PPO | Source: Ambulatory Visit | Attending: Orthopedic Surgery | Admitting: Orthopedic Surgery

## 2016-08-02 DIAGNOSIS — M1611 Unilateral primary osteoarthritis, right hip: Secondary | ICD-10-CM | POA: Insufficient documentation

## 2016-08-02 DIAGNOSIS — Z01812 Encounter for preprocedural laboratory examination: Secondary | ICD-10-CM | POA: Insufficient documentation

## 2016-08-02 DIAGNOSIS — Z0183 Encounter for blood typing: Secondary | ICD-10-CM | POA: Insufficient documentation

## 2016-08-02 HISTORY — DX: Unspecified osteoarthritis, unspecified site: M19.90

## 2016-08-02 HISTORY — DX: Pneumonia, unspecified organism: J18.9

## 2016-08-02 LAB — CBC
HCT: 45.3 % (ref 36.0–46.0)
Hemoglobin: 14.9 g/dL (ref 12.0–15.0)
MCH: 29.5 pg (ref 26.0–34.0)
MCHC: 32.9 g/dL (ref 30.0–36.0)
MCV: 89.7 fL (ref 78.0–100.0)
PLATELETS: 363 10*3/uL (ref 150–400)
RBC: 5.05 MIL/uL (ref 3.87–5.11)
RDW: 13.3 % (ref 11.5–15.5)
WBC: 8.6 10*3/uL (ref 4.0–10.5)

## 2016-08-02 LAB — TYPE AND SCREEN
ABO/RH(D): O POS
ANTIBODY SCREEN: NEGATIVE

## 2016-08-02 LAB — BASIC METABOLIC PANEL
Anion gap: 11 (ref 5–15)
BUN: 18 mg/dL (ref 6–20)
CHLORIDE: 103 mmol/L (ref 101–111)
CO2: 25 mmol/L (ref 22–32)
CREATININE: 0.58 mg/dL (ref 0.44–1.00)
Calcium: 10 mg/dL (ref 8.9–10.3)
Glucose, Bld: 154 mg/dL — ABNORMAL HIGH (ref 65–99)
POTASSIUM: 3.9 mmol/L (ref 3.5–5.1)
SODIUM: 139 mmol/L (ref 135–145)

## 2016-08-02 LAB — URINALYSIS, ROUTINE W REFLEX MICROSCOPIC
BILIRUBIN URINE: NEGATIVE
HGB URINE DIPSTICK: NEGATIVE
KETONES UR: NEGATIVE mg/dL
Leukocytes, UA: NEGATIVE
NITRITE: NEGATIVE
PH: 5 (ref 5.0–8.0)
Protein, ur: NEGATIVE mg/dL
SPECIFIC GRAVITY, URINE: 1.041 — AB (ref 1.005–1.030)

## 2016-08-02 LAB — PROTIME-INR
INR: 0.98
Prothrombin Time: 13 seconds (ref 11.4–15.2)

## 2016-08-02 LAB — SURGICAL PCR SCREEN
MRSA, PCR: NEGATIVE
Staphylococcus aureus: NEGATIVE

## 2016-08-02 LAB — APTT: APTT: 24 s (ref 24–36)

## 2016-08-02 LAB — URINE MICROSCOPIC-ADD ON
BACTERIA UA: NONE SEEN
RBC / HPF: NONE SEEN RBC/hpf (ref 0–5)

## 2016-08-02 LAB — ABO/RH: ABO/RH(D): O POS

## 2016-08-02 LAB — GLUCOSE, CAPILLARY: Glucose-Capillary: 158 mg/dL — ABNORMAL HIGH (ref 65–99)

## 2016-08-02 NOTE — Progress Notes (Signed)
PCP: robbin Zanard,MD @[facility],[place], she manages pt's diabetes. Will request: ekg,hgb A1C,and notes.  Fasting sugars 100-120.

## 2016-08-04 LAB — URINE CULTURE: Culture: >=100000 — AB

## 2016-08-05 NOTE — Progress Notes (Signed)
Left a message for Dr.T Eulah PontMurphy about urine culture showing E Coli.

## 2016-08-12 MED ORDER — CEFAZOLIN SODIUM-DEXTROSE 2-4 GM/100ML-% IV SOLN
2.0000 g | INTRAVENOUS | Status: AC
  Administered 2016-08-13: 2 g via INTRAVENOUS
  Filled 2016-08-12: qty 100

## 2016-08-12 MED ORDER — TRANEXAMIC ACID 1000 MG/10ML IV SOLN
1000.0000 mg | INTRAVENOUS | Status: AC
  Administered 2016-08-13: 1000 mg via INTRAVENOUS
  Filled 2016-08-12: qty 10

## 2016-08-12 MED ORDER — ACETAMINOPHEN 500 MG PO TABS
1000.0000 mg | ORAL_TABLET | Freq: Once | ORAL | Status: AC
  Administered 2016-08-13: 1000 mg via ORAL
  Filled 2016-08-12: qty 2

## 2016-08-12 NOTE — Anesthesia Preprocedure Evaluation (Signed)
Anesthesia Evaluation  Patient identified by MRN, date of birth, ID band Patient awake    Reviewed: Allergy & Precautions, NPO status , Patient's Chart, lab work & pertinent test results  Airway Mallampati: II  TM Distance: >3 FB Neck ROM: Full    Dental no notable dental hx.    Pulmonary neg pulmonary ROS, former smoker,    Pulmonary exam normal breath sounds clear to auscultation       Cardiovascular hypertension, negative cardio ROS Normal cardiovascular exam Rhythm:Regular Rate:Normal     Neuro/Psych negative neurological ROS  negative psych ROS   GI/Hepatic negative GI ROS, Neg liver ROS,   Endo/Other  negative endocrine ROSdiabetes  Renal/GU negative Renal ROS  negative genitourinary   Musculoskeletal negative musculoskeletal ROS (+)   Abdominal   Peds negative pediatric ROS (+)  Hematology negative hematology ROS (+)   Anesthesia Other Findings   Reproductive/Obstetrics negative OB ROS                             Anesthesia Physical Anesthesia Plan  ASA: II  Anesthesia Plan: Spinal and General   Post-op Pain Management:    Induction:   Airway Management Planned:   Additional Equipment:   Intra-op Plan:   Post-operative Plan:   Informed Consent: I have reviewed the patients History and Physical, chart, labs and discussed the procedure including the risks, benefits and alternatives for the proposed anesthesia with the patient or authorized representative who has indicated his/her understanding and acceptance.   Dental advisory given  Plan Discussed with: CRNA  Anesthesia Plan Comments:        Anesthesia Quick Evaluation

## 2016-08-13 ENCOUNTER — Encounter (HOSPITAL_COMMUNITY)
Admission: RE | Disposition: A | Discharge status: Home Health | Payer: Self-pay | Source: Ambulatory Visit | Attending: Orthopedic Surgery

## 2016-08-13 ENCOUNTER — Inpatient Hospital Stay (HOSPITAL_COMMUNITY): Payer: BC Managed Care – PPO | Admitting: Anesthesiology

## 2016-08-13 ENCOUNTER — Encounter (HOSPITAL_COMMUNITY): Payer: Self-pay | Admitting: Anesthesiology

## 2016-08-13 ENCOUNTER — Inpatient Hospital Stay (HOSPITAL_COMMUNITY): Payer: BC Managed Care – PPO

## 2016-08-13 ENCOUNTER — Inpatient Hospital Stay (HOSPITAL_COMMUNITY)
Admission: RE | Admit: 2016-08-13 | Discharge: 2016-08-14 | DRG: 470 | Disposition: A | Discharge status: Home Health | Payer: BC Managed Care – PPO | Source: Ambulatory Visit | Attending: Orthopedic Surgery | Admitting: Orthopedic Surgery

## 2016-08-13 DIAGNOSIS — Z91010 Allergy to peanuts: Secondary | ICD-10-CM

## 2016-08-13 DIAGNOSIS — D649 Anemia, unspecified: Secondary | ICD-10-CM | POA: Diagnosis present

## 2016-08-13 DIAGNOSIS — E785 Hyperlipidemia, unspecified: Secondary | ICD-10-CM | POA: Diagnosis present

## 2016-08-13 DIAGNOSIS — F32A Depression, unspecified: Secondary | ICD-10-CM | POA: Diagnosis present

## 2016-08-13 DIAGNOSIS — E119 Type 2 diabetes mellitus without complications: Secondary | ICD-10-CM | POA: Diagnosis present

## 2016-08-13 DIAGNOSIS — I10 Essential (primary) hypertension: Secondary | ICD-10-CM | POA: Diagnosis present

## 2016-08-13 DIAGNOSIS — Z7982 Long term (current) use of aspirin: Secondary | ICD-10-CM | POA: Diagnosis not present

## 2016-08-13 DIAGNOSIS — E781 Pure hyperglyceridemia: Secondary | ICD-10-CM | POA: Diagnosis present

## 2016-08-13 DIAGNOSIS — Z886 Allergy status to analgesic agent status: Secondary | ICD-10-CM | POA: Diagnosis not present

## 2016-08-13 DIAGNOSIS — M25551 Pain in right hip: Secondary | ICD-10-CM | POA: Diagnosis present

## 2016-08-13 DIAGNOSIS — M1611 Unilateral primary osteoarthritis, right hip: Principal | ICD-10-CM | POA: Diagnosis present

## 2016-08-13 DIAGNOSIS — R300 Dysuria: Secondary | ICD-10-CM | POA: Diagnosis present

## 2016-08-13 DIAGNOSIS — R41840 Attention and concentration deficit: Secondary | ICD-10-CM | POA: Diagnosis present

## 2016-08-13 DIAGNOSIS — F329 Major depressive disorder, single episode, unspecified: Secondary | ICD-10-CM | POA: Diagnosis present

## 2016-08-13 DIAGNOSIS — Z419 Encounter for procedure for purposes other than remedying health state, unspecified: Secondary | ICD-10-CM

## 2016-08-13 DIAGNOSIS — G4700 Insomnia, unspecified: Secondary | ICD-10-CM | POA: Diagnosis present

## 2016-08-13 DIAGNOSIS — Z91048 Other nonmedicinal substance allergy status: Secondary | ICD-10-CM | POA: Diagnosis not present

## 2016-08-13 DIAGNOSIS — Z87891 Personal history of nicotine dependence: Secondary | ICD-10-CM | POA: Diagnosis not present

## 2016-08-13 DIAGNOSIS — F411 Generalized anxiety disorder: Secondary | ICD-10-CM | POA: Diagnosis present

## 2016-08-13 DIAGNOSIS — N951 Menopausal and female climacteric states: Secondary | ICD-10-CM | POA: Diagnosis present

## 2016-08-13 HISTORY — PX: TOTAL HIP ARTHROPLASTY: SHX124

## 2016-08-13 LAB — GLUCOSE, CAPILLARY
GLUCOSE-CAPILLARY: 143 mg/dL — AB (ref 65–99)
Glucose-Capillary: 135 mg/dL — ABNORMAL HIGH (ref 65–99)
Glucose-Capillary: 230 mg/dL — ABNORMAL HIGH (ref 65–99)
Glucose-Capillary: 181 mg/dL — ABNORMAL HIGH (ref 65–99)

## 2016-08-13 SURGERY — ARTHROPLASTY, HIP, TOTAL, ANTERIOR APPROACH
Anesthesia: General | Laterality: Right

## 2016-08-13 MED ORDER — METHOCARBAMOL 1000 MG/10ML IJ SOLN
500.0000 mg | Freq: Four times a day (QID) | INTRAVENOUS | Status: DC | PRN
  Filled 2016-08-13: qty 5

## 2016-08-13 MED ORDER — ASPIRIN EC 325 MG PO TBEC: 325.0000 mg | DELAYED_RELEASE_TABLET | Freq: Every day | ORAL | 0 refills | Status: DC

## 2016-08-13 MED ORDER — HYDROCHLOROTHIAZIDE 12.5 MG PO CAPS
12.5000 mg | ORAL_CAPSULE | Freq: Every day | ORAL | Status: DC
  Administered 2016-08-14: 12.5 mg via ORAL
  Filled 2016-08-13: qty 1

## 2016-08-13 MED ORDER — LIDOCAINE 2% (20 MG/ML) 5 ML SYRINGE
INTRAMUSCULAR | Status: AC
  Filled 2016-08-13: qty 5

## 2016-08-13 MED ORDER — EPHEDRINE SULFATE 50 MG/ML IJ SOLN
INTRAMUSCULAR | Status: DC | PRN
  Administered 2016-08-13 (×2): 10 mg via INTRAVENOUS

## 2016-08-13 MED ORDER — FENTANYL CITRATE (PF) 100 MCG/2ML IJ SOLN
INTRAMUSCULAR | Status: AC
  Filled 2016-08-13: qty 2

## 2016-08-13 MED ORDER — KETOROLAC TROMETHAMINE 15 MG/ML IJ SOLN
15.0000 mg | Freq: Four times a day (QID) | INTRAMUSCULAR | Status: AC
  Administered 2016-08-13 – 2016-08-14 (×3): 15 mg via INTRAVENOUS
  Filled 2016-08-13 (×3): qty 1

## 2016-08-13 MED ORDER — MIDAZOLAM HCL 5 MG/5ML IJ SOLN
INTRAMUSCULAR | Status: DC | PRN
  Administered 2016-08-13: 2 mg via INTRAVENOUS

## 2016-08-13 MED ORDER — ONDANSETRON HCL 4 MG/2ML IJ SOLN
INTRAMUSCULAR | Status: AC
  Filled 2016-08-13: qty 2

## 2016-08-13 MED ORDER — SODIUM CHLORIDE FLUSH 0.9 % IV SOLN
INTRAVENOUS | Status: DC | PRN
  Administered 2016-08-13 (×2): 10 mL

## 2016-08-13 MED ORDER — ONDANSETRON HCL 4 MG/2ML IJ SOLN
INTRAMUSCULAR | Status: DC | PRN
  Administered 2016-08-13: 4 mg via INTRAVENOUS

## 2016-08-13 MED ORDER — OXYCODONE HCL 5 MG PO TABS
5.0000 mg | ORAL_TABLET | ORAL | Status: DC | PRN
  Administered 2016-08-13: 5 mg via ORAL
  Filled 2016-08-13: qty 1
  Filled 2016-08-13: qty 2

## 2016-08-13 MED ORDER — OMEPRAZOLE 20 MG PO CPDR: 20.0000 mg | DELAYED_RELEASE_CAPSULE | Freq: Every day | ORAL | 2 refills | Status: DC

## 2016-08-13 MED ORDER — DOCUSATE SODIUM 100 MG PO CAPS
100.0000 mg | ORAL_CAPSULE | Freq: Two times a day (BID) | ORAL | Status: DC
  Administered 2016-08-13 – 2016-08-14 (×2): 100 mg via ORAL
  Filled 2016-08-13 (×2): qty 1

## 2016-08-13 MED ORDER — MENTHOL 3 MG MT LOZG
1.0000 | LOZENGE | OROMUCOSAL | Status: DC | PRN
Start: 2016-08-13 — End: 2016-08-14

## 2016-08-13 MED ORDER — LISINOPRIL 20 MG PO TABS
20.0000 mg | ORAL_TABLET | Freq: Every day | ORAL | Status: DC
  Filled 2016-08-13: qty 1

## 2016-08-13 MED ORDER — PHENOL 1.4 % MT LIQD: 1.0000 | OROMUCOSAL | Status: DC | PRN

## 2016-08-13 MED ORDER — BUPIVACAINE-EPINEPHRINE 0.25% -1:200000 IJ SOLN
INTRAMUSCULAR | Status: DC | PRN
Start: 2016-08-13 — End: 2016-08-13
  Administered 2016-08-13: 30 mL

## 2016-08-13 MED ORDER — ACETAMINOPHEN 650 MG RE SUPP: 650.0000 mg | Freq: Four times a day (QID) | RECTAL | Status: DC | PRN

## 2016-08-13 MED ORDER — EPHEDRINE 5 MG/ML INJ
INTRAVENOUS | Status: AC
  Filled 2016-08-13: qty 10

## 2016-08-13 MED ORDER — PROPOFOL 500 MG/50ML IV EMUL
INTRAVENOUS | Status: DC | PRN
  Administered 2016-08-13: 50 ug/kg/min via INTRAVENOUS

## 2016-08-13 MED ORDER — PHENYLEPHRINE HCL 10 MG/ML IJ SOLN
INTRAMUSCULAR | Status: DC | PRN
  Administered 2016-08-13: 80 ug via INTRAVENOUS
  Administered 2016-08-13: 160 ug via INTRAVENOUS
  Administered 2016-08-13: 120 ug via INTRAVENOUS
  Administered 2016-08-13 (×2): 80 ug via INTRAVENOUS

## 2016-08-13 MED ORDER — ASPIRIN EC 325 MG PO TBEC
325.0000 mg | DELAYED_RELEASE_TABLET | Freq: Every day | ORAL | Status: DC
  Administered 2016-08-14: 325 mg via ORAL
  Filled 2016-08-13: qty 1

## 2016-08-13 MED ORDER — INSULIN ASPART 100 UNIT/ML ~~LOC~~ SOLN
0.0000 [IU] | Freq: Three times a day (TID) | SUBCUTANEOUS | Status: DC
Start: 2016-08-13 — End: 2016-08-14
  Administered 2016-08-13 – 2016-08-14 (×3): 3 [IU] via SUBCUTANEOUS

## 2016-08-13 MED ORDER — KETOROLAC TROMETHAMINE 30 MG/ML IJ SOLN
INTRAMUSCULAR | Status: DC | PRN
  Administered 2016-08-13: 30 mg via INTRAVENOUS

## 2016-08-13 MED ORDER — DEXAMETHASONE SODIUM PHOSPHATE 10 MG/ML IJ SOLN
10.0000 mg | Freq: Once | INTRAMUSCULAR | Status: AC
  Administered 2016-08-14: 10 mg via INTRAVENOUS
  Filled 2016-08-13: qty 1

## 2016-08-13 MED ORDER — ACETAMINOPHEN 325 MG PO TABS: 650.0000 mg | ORAL_TABLET | Freq: Four times a day (QID) | ORAL | Status: DC | PRN

## 2016-08-13 MED ORDER — LISINOPRIL-HYDROCHLOROTHIAZIDE 20-25 MG PO TABS: 0.5000 | ORAL_TABLET | Freq: Every day | ORAL | Status: DC

## 2016-08-13 MED ORDER — MORPHINE SULFATE (PF) 2 MG/ML IV SOLN
2.0000 mg | INTRAVENOUS | Status: DC | PRN
  Administered 2016-08-13: 2 mg via INTRAVENOUS
  Filled 2016-08-13: qty 1

## 2016-08-13 MED ORDER — DEXTROSE 5 % IV SOLN
INTRAVENOUS | Status: DC | PRN
  Administered 2016-08-13: 50 ug/min via INTRAVENOUS

## 2016-08-13 MED ORDER — METOCLOPRAMIDE HCL 5 MG PO TABS: 5.0000 mg | ORAL_TABLET | Freq: Three times a day (TID) | ORAL | Status: DC | PRN

## 2016-08-13 MED ORDER — HYDROMORPHONE HCL 1 MG/ML IJ SOLN: 0.2500 mg | INTRAMUSCULAR | Status: DC | PRN

## 2016-08-13 MED ORDER — BUPIVACAINE HCL (PF) 0.5 % IJ SOLN
INTRAMUSCULAR | Status: DC | PRN
  Administered 2016-08-13: 3 mL via INTRATHECAL

## 2016-08-13 MED ORDER — MIDAZOLAM HCL 2 MG/2ML IJ SOLN
INTRAMUSCULAR | Status: AC
  Filled 2016-08-13: qty 2

## 2016-08-13 MED ORDER — FENTANYL CITRATE (PF) 100 MCG/2ML IJ SOLN
INTRAMUSCULAR | Status: DC | PRN
  Administered 2016-08-13: 100 ug via INTRAVENOUS
  Administered 2016-08-13 (×2): 50 ug via INTRAVENOUS

## 2016-08-13 MED ORDER — LISDEXAMFETAMINE DIMESYLATE 70 MG PO CAPS
70.0000 mg | ORAL_CAPSULE | Freq: Every day | ORAL | Status: DC
  Administered 2016-08-14: 70 mg via ORAL
  Filled 2016-08-13: qty 1

## 2016-08-13 MED ORDER — PROMETHAZINE HCL 25 MG/ML IJ SOLN: 6.2500 mg | INTRAMUSCULAR | Status: DC | PRN

## 2016-08-13 MED ORDER — BUPIVACAINE-EPINEPHRINE (PF) 0.25% -1:200000 IJ SOLN
INTRAMUSCULAR | Status: AC
  Filled 2016-08-13: qty 30

## 2016-08-13 MED ORDER — DEXTROSE-NACL 5-0.45 % IV SOLN: INTRAVENOUS | Status: DC

## 2016-08-13 MED ORDER — METHOCARBAMOL 500 MG PO TABS
500.0000 mg | ORAL_TABLET | Freq: Four times a day (QID) | ORAL | Status: DC | PRN
  Administered 2016-08-13: 500 mg via ORAL
  Filled 2016-08-13: qty 1

## 2016-08-13 MED ORDER — LISINOPRIL 10 MG PO TABS
10.0000 mg | ORAL_TABLET | Freq: Every day | ORAL | Status: DC
  Administered 2016-08-14: 10 mg via ORAL
  Filled 2016-08-13: qty 1

## 2016-08-13 MED ORDER — SENNA 8.6 MG PO TABS
1.0000 | ORAL_TABLET | Freq: Two times a day (BID) | ORAL | Status: DC
  Administered 2016-08-13 – 2016-08-14 (×2): 8.6 mg via ORAL
  Filled 2016-08-13 (×2): qty 1

## 2016-08-13 MED ORDER — METHOCARBAMOL 500 MG PO TABS: 500.0000 mg | ORAL_TABLET | Freq: Four times a day (QID) | ORAL | 0 refills | Status: DC | PRN

## 2016-08-13 MED ORDER — PROPOFOL 10 MG/ML IV BOLUS
INTRAVENOUS | Status: DC | PRN
  Administered 2016-08-13: 20 mg via INTRAVENOUS

## 2016-08-13 MED ORDER — MEPERIDINE HCL 25 MG/ML IJ SOLN: 6.2500 mg | INTRAMUSCULAR | Status: DC | PRN

## 2016-08-13 MED ORDER — PHENYLEPHRINE 40 MCG/ML (10ML) SYRINGE FOR IV PUSH (FOR BLOOD PRESSURE SUPPORT)
PREFILLED_SYRINGE | INTRAVENOUS | Status: AC
  Filled 2016-08-13: qty 10

## 2016-08-13 MED ORDER — ONDANSETRON HCL 4 MG/2ML IJ SOLN: 4.0000 mg | Freq: Four times a day (QID) | INTRAMUSCULAR | Status: DC | PRN

## 2016-08-13 MED ORDER — LIDOCAINE HCL (CARDIAC) 20 MG/ML IV SOLN
INTRAVENOUS | Status: DC | PRN
  Administered 2016-08-13: 40 mg via INTRAVENOUS

## 2016-08-13 MED ORDER — TRANEXAMIC ACID 1000 MG/10ML IV SOLN
1000.0000 mg | INTRAVENOUS | Status: DC
  Filled 2016-08-13 (×2): qty 10

## 2016-08-13 MED ORDER — DIPHENHYDRAMINE HCL 12.5 MG/5ML PO ELIX: 12.5000 mg | ORAL_SOLUTION | ORAL | Status: DC | PRN

## 2016-08-13 MED ORDER — CELECOXIB 200 MG PO CAPS
200.0000 mg | ORAL_CAPSULE | Freq: Two times a day (BID) | ORAL | Status: DC
  Administered 2016-08-13 – 2016-08-14 (×2): 200 mg via ORAL
  Filled 2016-08-13 (×2): qty 1

## 2016-08-13 MED ORDER — HYDROCHLOROTHIAZIDE 25 MG PO TABS
25.0000 mg | ORAL_TABLET | Freq: Every day | ORAL | Status: DC
  Filled 2016-08-13: qty 1

## 2016-08-13 MED ORDER — DOCUSATE SODIUM 100 MG PO CAPS: 100.0000 mg | ORAL_CAPSULE | Freq: Two times a day (BID) | ORAL | 0 refills | Status: DC

## 2016-08-13 MED ORDER — ONDANSETRON HCL 4 MG PO TABS: 4.0000 mg | ORAL_TABLET | Freq: Three times a day (TID) | ORAL | 0 refills | Status: DC | PRN

## 2016-08-13 MED ORDER — ONDANSETRON HCL 4 MG PO TABS: 4.0000 mg | ORAL_TABLET | Freq: Four times a day (QID) | ORAL | Status: DC | PRN

## 2016-08-13 MED ORDER — ALBUMIN HUMAN 5 % IV SOLN
INTRAVENOUS | Status: DC | PRN
  Administered 2016-08-13: 08:00:00 via INTRAVENOUS

## 2016-08-13 MED ORDER — METFORMIN HCL ER 500 MG PO TB24
1000.0000 mg | ORAL_TABLET | Freq: Two times a day (BID) | ORAL | Status: DC
  Administered 2016-08-13 – 2016-08-14 (×3): 1000 mg via ORAL
  Filled 2016-08-13 (×4): qty 2

## 2016-08-13 MED ORDER — ACETAMINOPHEN 325 MG PO TABS
650.0000 mg | ORAL_TABLET | Freq: Four times a day (QID) | ORAL | Status: AC
  Administered 2016-08-13 – 2016-08-14 (×3): 650 mg via ORAL
  Filled 2016-08-13 (×3): qty 2

## 2016-08-13 MED ORDER — PRAVASTATIN SODIUM 40 MG PO TABS
40.0000 mg | ORAL_TABLET | Freq: Every day | ORAL | Status: DC
  Administered 2016-08-13: 40 mg via ORAL
  Filled 2016-08-13: qty 1

## 2016-08-13 MED ORDER — SORBITOL 70 % SOLN: 30.0000 mL | Freq: Every day | Status: DC | PRN

## 2016-08-13 MED ORDER — METOCLOPRAMIDE HCL 5 MG/ML IJ SOLN: 5.0000 mg | Freq: Three times a day (TID) | INTRAMUSCULAR | Status: DC | PRN

## 2016-08-13 MED ORDER — LACTATED RINGERS IV SOLN
INTRAVENOUS | Status: DC | PRN
  Administered 2016-08-13 (×2): via INTRAVENOUS

## 2016-08-13 MED ORDER — CHLORHEXIDINE GLUCONATE 4 % EX LIQD: 60.0000 mL | Freq: Once | CUTANEOUS | Status: DC

## 2016-08-13 MED ORDER — 0.9 % SODIUM CHLORIDE (POUR BTL) OPTIME
TOPICAL | Status: DC | PRN
  Administered 2016-08-13: 1000 mL

## 2016-08-13 MED ORDER — CANAGLIFLOZIN 100 MG PO TABS
100.0000 mg | ORAL_TABLET | Freq: Two times a day (BID) | ORAL | Status: DC
  Administered 2016-08-14 (×2): 100 mg via ORAL
  Filled 2016-08-13 (×3): qty 1

## 2016-08-13 MED ORDER — CEFAZOLIN SODIUM-DEXTROSE 2-4 GM/100ML-% IV SOLN
2.0000 g | Freq: Four times a day (QID) | INTRAVENOUS | Status: AC
  Administered 2016-08-13 (×2): 2 g via INTRAVENOUS
  Filled 2016-08-13 (×3): qty 100

## 2016-08-13 MED ORDER — OXYCODONE-ACETAMINOPHEN 5-325 MG PO TABS: 1.0000 | ORAL_TABLET | ORAL | 0 refills | Status: DC | PRN

## 2016-08-13 MED ORDER — DULOXETINE HCL 60 MG PO CPEP
60.0000 mg | ORAL_CAPSULE | Freq: Every day | ORAL | Status: DC
  Administered 2016-08-14: 60 mg via ORAL
  Filled 2016-08-13: qty 1

## 2016-08-13 MED ORDER — PROPOFOL 10 MG/ML IV BOLUS
INTRAVENOUS | Status: AC
  Filled 2016-08-13: qty 40

## 2016-08-13 MED ORDER — DAPAGLIFLOZIN PRO-METFORMIN ER 5-1000 MG PO TB24: 1.0000 | ORAL_TABLET | Freq: Two times a day (BID) | ORAL | Status: DC

## 2016-08-13 SURGICAL SUPPLY — 54 items
BAG DECANTER FOR FLEXI CONT (MISCELLANEOUS) IMPLANT
BENZOIN TINCTURE PRP APPL 2/3 (GAUZE/BANDAGES/DRESSINGS) ×2 IMPLANT
BLADE SAG 18X100X1.27 (BLADE) IMPLANT
BLADE SAW SGTL 18X1.27X75 (BLADE) IMPLANT
BLADE SURG ROTATE 9660 (MISCELLANEOUS) IMPLANT
CAPT HIP TOTAL 2 ×2 IMPLANT
CLSR STERI-STRIP ANTIMIC 1/2X4 (GAUZE/BANDAGES/DRESSINGS) ×2 IMPLANT
COVER PERINEAL POST (MISCELLANEOUS) ×2 IMPLANT
COVER SURGICAL LIGHT HANDLE (MISCELLANEOUS) ×2 IMPLANT
DRAPE C-ARM 42X72 X-RAY (DRAPES) ×2 IMPLANT
DRAPE IMP U-DRAPE 54X76 (DRAPES) ×2 IMPLANT
DRAPE STERI IOBAN 125X83 (DRAPES) IMPLANT
DRAPE U-SHAPE 47X51 STRL (DRAPES) IMPLANT
DRSG MEPILEX BORDER 4X8 (GAUZE/BANDAGES/DRESSINGS) ×2 IMPLANT
DURAPREP 26ML APPLICATOR (WOUND CARE) ×2 IMPLANT
ELECT BLADE 4.0 EZ CLEAN MEGAD (MISCELLANEOUS) ×2
ELECT REM PT RETURN 9FT ADLT (ELECTROSURGICAL) ×2
ELECTRODE BLDE 4.0 EZ CLN MEGD (MISCELLANEOUS) ×1 IMPLANT
ELECTRODE REM PT RTRN 9FT ADLT (ELECTROSURGICAL) ×1 IMPLANT
FACESHIELD WRAPAROUND (MASK) ×4 IMPLANT
GLOVE BIO SURGEON STRL SZ7.5 (GLOVE) ×4 IMPLANT
GLOVE BIOGEL PI IND STRL 8 (GLOVE) ×2 IMPLANT
GLOVE BIOGEL PI INDICATOR 8 (GLOVE) ×2
GOWN STRL REUS W/ TWL LRG LVL3 (GOWN DISPOSABLE) ×2 IMPLANT
GOWN STRL REUS W/ TWL XL LVL3 (GOWN DISPOSABLE) IMPLANT
GOWN STRL REUS W/TWL LRG LVL3 (GOWN DISPOSABLE) ×2
GOWN STRL REUS W/TWL XL LVL3 (GOWN DISPOSABLE)
HIP CAPITATED TOTAL 2 ×1 IMPLANT
KIT BASIN OR (CUSTOM PROCEDURE TRAY) ×2 IMPLANT
KIT ROOM TURNOVER OR (KITS) ×2 IMPLANT
MANIFOLD NEPTUNE II (INSTRUMENTS) ×2 IMPLANT
NDL SAFETY ECLIPSE 18X1.5 (NEEDLE) ×1 IMPLANT
NEEDLE 18GX1X1/2 (RX/OR ONLY) (NEEDLE) IMPLANT
NEEDLE HYPO 18GX1.5 SHARP (NEEDLE) ×1
NS IRRIG 1000ML POUR BTL (IV SOLUTION) ×2 IMPLANT
PACK TOTAL JOINT (CUSTOM PROCEDURE TRAY) ×2 IMPLANT
PACK UNIVERSAL I (CUSTOM PROCEDURE TRAY) IMPLANT
PAD ARMBOARD 7.5X6 YLW CONV (MISCELLANEOUS) ×2 IMPLANT
SLEEVE SURGEON STRL (DRAPES) ×2 IMPLANT
SPONGE LAP 18X18 X RAY DECT (DISPOSABLE) IMPLANT
SUT MNCRL AB 4-0 PS2 18 (SUTURE) ×2 IMPLANT
SUT MON AB 2-0 CT1 36 (SUTURE) ×2 IMPLANT
SUT VIC AB 0 CT1 27 (SUTURE) ×1
SUT VIC AB 0 CT1 27XBRD ANBCTR (SUTURE) ×1 IMPLANT
SUT VIC AB 1 CT1 27 (SUTURE) ×1
SUT VIC AB 1 CT1 27XBRD ANBCTR (SUTURE) ×1 IMPLANT
SYR 50ML LL SCALE MARK (SYRINGE) ×2 IMPLANT
SYRINGE 20CC LL (MISCELLANEOUS) IMPLANT
TOWEL OR 17X24 6PK STRL BLUE (TOWEL DISPOSABLE) ×2 IMPLANT
TOWEL OR 17X26 10 PK STRL BLUE (TOWEL DISPOSABLE) ×2 IMPLANT
TRAY CATH 16FR W/PLASTIC CATH (SET/KITS/TRAYS/PACK) ×2 IMPLANT
TRAY FOLEY CATH 16FRSI W/METER (SET/KITS/TRAYS/PACK) ×2 IMPLANT
WATER STERILE IRR 1000ML POUR (IV SOLUTION) IMPLANT
YANKAUER SUCT BULB TIP NO VENT (SUCTIONS) ×2 IMPLANT

## 2016-08-13 NOTE — Anesthesia Procedure Notes (Signed)
Spinal  Patient location during procedure: OR Staffing Anesthesiologist: Vani Gunner Performed: anesthesiologist  Preanesthetic Checklist Completed: patient identified, site marked, surgical consent, pre-op evaluation, timeout performed, IV checked, risks and benefits discussed and monitors and equipment checked Spinal Block Patient position: sitting Prep: ChloraPrep Patient monitoring: heart rate, continuous pulse ox and blood pressure Approach: right paramedian Location: L2-3 Injection technique: single-shot Needle Needle type: Sprotte  Needle gauge: 24 G Needle length: 9 cm Additional Notes Expiration date of kit checked and confirmed. Patient tolerated procedure well, without complications.       

## 2016-08-13 NOTE — Discharge Instructions (Signed)

## 2016-08-13 NOTE — Transfer of Care (Signed)
Immediate Anesthesia Transfer of Care Note  Patient: Danielle CockingNancy J Vandrunen  Procedure(s) Performed: Procedure(s): TOTAL HIP ARTHROPLASTY ANTERIOR APPROACH (Right)  Patient Location: PACU  Anesthesia Type:MAC and Spinal  Level of Consciousness: awake, alert , oriented and sedated  Airway & Oxygen Therapy: Patient Spontanous Breathing and Patient connected to nasal cannula oxygen  Post-op Assessment: Report given to RN, Post -op Vital signs reviewed and stable and Patient moving all extremities  Post vital signs: Reviewed and stable  Last Vitals:  Vitals:   08/13/16 0628  BP: 133/61  Pulse: 96  Resp: 20  Temp: 36.8 C    Last Pain: There were no vitals filed for this visit.       Complications: No apparent anesthesia complications

## 2016-08-13 NOTE — Progress Notes (Signed)
Rept received from Pandora LeiterLaura Allen RN. Pt sitting comfortably in recliner with family at bedside.

## 2016-08-13 NOTE — Transfer of Care (Signed)
Immediate Anesthesia Transfer of Care Note  Patient: Danielle Riley  Procedure(s) Performed: Procedure(s): TOTAL HIP ARTHROPLASTY ANTERIOR APPROACH (Right)  Patient Location: PACU  Anesthesia Type:MAC and Spinal  Level of Consciousness: awake, alert , oriented and sedated  Airway & Oxygen Therapy: Patient Spontanous Breathing and Patient connected to nasal cannula oxygen  Post-op Assessment: Report given to RN, Post -op Vital signs reviewed and stable and Patient moving all extremities  Post vital signs: Reviewed and stable  Last Vitals:  Vitals:   08/13/16 0628  BP: 133/61  Pulse: 96  Resp: 20  Temp: 36.8 C    Last Pain: There were no vitals filed for this visit.       Complications: No apparent anesthesia complications 

## 2016-08-13 NOTE — Anesthesia Postprocedure Evaluation (Signed)
Anesthesia Post Note  Patient: Danielle Riley  Procedure(s) Performed: Procedure(s) (LRB): TOTAL HIP ARTHROPLASTY ANTERIOR APPROACH (Right)  Patient location during evaluation: PACU Anesthesia Type: Spinal and MAC Level of consciousness: awake and alert Pain management: pain level controlled Vital Signs Assessment: post-procedure vital signs reviewed and stable Respiratory status: spontaneous breathing and respiratory function stable Cardiovascular status: blood pressure returned to baseline and stable Postop Assessment: spinal receding Anesthetic complications: no    Last Vitals:  Vitals:   08/13/16 1349 08/13/16 1416  BP:  (!) 158/64  Pulse:  96  Resp:  18  Temp: 36.5 C 36.7 C    Last Pain:  Vitals:   08/13/16 1416  TempSrc: Oral  PainSc: 0-No pain                 Lewie LoronJohn Iline Buchinger

## 2016-08-13 NOTE — Interval H&P Note (Signed)
History and Physical Interval Note:  08/13/2016 7:30 AM  Danielle Riley  has presented today for surgery, with the diagnosis of OA RIGHT HIP  The various methods of treatment have been discussed with the patient and family. After consideration of risks, benefits and other options for treatment, the patient has consented to  Procedure(s): TOTAL HIP ARTHROPLASTY ANTERIOR APPROACH (Right) as a surgical intervention .  The patient's history has been reviewed, patient examined, no change in status, stable for surgery.  I have reviewed the patient's chart and labs.  Questions were answered to the patient's satisfaction.     Carleen Rhue D

## 2016-08-13 NOTE — Anesthesia Procedure Notes (Signed)
Procedure Name: MAC Date/Time: 08/13/2016 7:40 AM Performed by: Fransisca KaufmannMEYER, Keeana Pieratt E Pre-anesthesia Checklist: Patient identified, Emergency Drugs available, Suction available, Patient being monitored and Timeout performed Patient Re-evaluated:Patient Re-evaluated prior to inductionOxygen Delivery Method: Simple face mask Placement Confirmation: positive ETCO2

## 2016-08-13 NOTE — Progress Notes (Signed)
Orthopedic Tech Progress Note Patient Details:  Danielle Riley [birth year] [id number]  Ortho Devices Ortho Device/Splint Location: Trapeze bar Ortho Device/Splint Interventions: Application   Saul FordyceJennifer C Jerusalem Wert 08/13/2016, 3:33 PM

## 2016-08-13 NOTE — Evaluation (Signed)
Physical Therapy Evaluation Patient Details Name: Danielle Riley MRN: [id number] DOB: May 14, 1956 Today's Date: 08/13/2016   History of Present Illness  60 y.o. female s/p Rt anterior THA. PMH: hypertension, diabetes, ADD.   Clinical Impression  Pt is s/p anterior THA resulting in the deficits listed below (see PT Problem List). Pt able to ambulate 15 feet with rw during initial PT session. Pt will benefit from skilled PT to increase their independence and safety with mobility to allow discharge to home with her sister to assist.      Follow Up Recommendations Home health PT;Supervision for mobility/OOB    Equipment Recommendations  Rolling walker with 5" wheels;3in1 (PT)    Recommendations for Other Services       Precautions / Restrictions Precautions Precautions: Fall Precaution Booklet Issued: Yes (comment) Precaution Comments: HEP provided Restrictions Weight Bearing Restrictions: Yes RLE Weight Bearing: Weight bearing as tolerated      Mobility  Bed Mobility Overal bed mobility: Needs Assistance Bed Mobility: Supine to Sit     Supine to sit: Min assist     General bed mobility comments: min assist with Rt LE and trunk to come to sitting EOB  Transfers Overall transfer level: Needs assistance Equipment used: Rolling walker (2 wheeled) Transfers: Sit to/from Stand Sit to Stand: Min assist         General transfer comment: cues for hand placement   Ambulation/Gait Ambulation/Gait assistance: Min guard Ambulation Distance (Feet): 15 Feet Assistive device: Rolling walker (2 wheeled) Gait Pattern/deviations: Step-to pattern;Decreased stance time - right;Decreased weight shift to right Gait velocity: decreased   General Gait Details: cues needed for sequence initially, improving strides as ambulation progressed.   Stairs            Wheelchair Mobility    Modified Rankin (Stroke Patients Only)       Balance Overall balance assessment: Needs  assistance Sitting-balance support: No upper extremity supported Sitting balance-Leahy Scale: Fair     Standing balance support: Bilateral upper extremity supported Standing balance-Leahy Scale: Poor Standing balance comment: using rw                             Pertinent Vitals/Pain Pain Assessment: No/denies pain    Home Living Family/patient expects to be discharged to:: Private residence Living Arrangements: Alone Available Help at Discharge: Family;Available 24 hours/day Type of Home: House Home Access: Stairs to enter Entrance Stairs-Rails: None Entrance Stairs-Number of Steps: 1 Home Layout: Two level Home Equipment: Walker - 4 wheels;Cane - single point Additional Comments: sister will be staying with her at D/C    Prior Function Level of Independence: Independent with assistive device(s)         Comments: used rollator or cane for ambulation, works as Scientist, 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      Hand Dominance        Extremity/Trunk Assessment   Upper Extremity Assessment: Defer to OT evaluation           Lower Extremity Assessment: RLE deficits/detail RLE Deficits / Details: needing min assist to move LE with bed mobility.        Communication   Communication: No difficulties  Cognition Arousal/Alertness: Awake/alert Behavior During Therapy: WFL for tasks assessed/performed Overall Cognitive Status: Within Functional Limits for tasks assessed                      General Comments      Exercises  Assessment/Plan    PT Assessment Patient needs continued PT services  PT Diagnosis Difficulty walking   PT Problem List Decreased strength;Decreased range of motion;Decreased activity tolerance;Decreased mobility;Decreased balance  PT Treatment Interventions DME instruction;Gait training;Stair training;Functional mobility training;Therapeutic activities;Therapeutic exercise;Patient/family education   PT Goals (Current goals can be found  in the Care Plan section) Acute Rehab PT Goals Patient Stated Goal: be active and not use device for ambulation PT Goal Formulation: With patient Time For Goal Achievement: 08/27/16 Potential to Achieve Goals: Good    Frequency 7X/week   Barriers to discharge        Co-evaluation               End of Session Equipment Utilized During Treatment: Gait belt Activity Tolerance: Patient tolerated treatment well Patient left: in chair;with call bell/phone within reach;with family/visitor present Nurse Communication: Mobility status;Weight bearing status         Time: [id number] PT Time Calculation (min) (ACUTE ONLY): 37 min   Charges:   PT Evaluation $PT Eval Moderate Complexity: 1 Procedure PT Treatments $Gait Training: 8-22 mins   PT G Codes:        Christiane HaBenjamin J. Alyza Artiaga, PT, CSCS Pager [phone] Office [phone]  08/13/2016, 3:20 PM

## 2016-08-13 NOTE — Op Note (Signed)
08/13/2016  9:16 AM  PATIENT:  Danielle Riley   MRN: [id number]  PRE-OPERATIVE DIAGNOSIS:  OA RIGHT HIP  POST-OPERATIVE DIAGNOSIS:  OA RIGHT HIP  PROCEDURE:  Procedure(s): TOTAL HIP ARTHROPLASTY ANTERIOR APPROACH  PREOPERATIVE INDICATIONS:    Danielle Riley is an 60 y.o. female who has a diagnosis of Primary osteoarthritis of right hip and elected for surgical management after failing conservative treatment.  The risks benefits and alternatives were discussed with the patient including but not limited to the risks of nonoperative treatment, versus surgical intervention including infection, bleeding, nerve injury, periprosthetic fracture, the need for revision surgery, dislocation, leg length discrepancy, blood clots, cardiopulmonary complications, morbidity, mortality, among others, and they were willing to proceed.     OPERATIVE REPORT     SURGEON:   Omarian Jaquith, Ernesta Amble, MD    ASSISTANT:  Roxan Hockey, PA-C, he was present and scrubbed throughout the case, critical for completion in a timely fashion, and for retraction, instrumentation, and closure.     ANESTHESIA:  General    COMPLICATIONS:  None.     COMPONENTS:  [employer]ker acolade fit femur size 4 with a 36 mm -2.5 head ball and a PSL acetabular shell size 50 with a  polyethylene liner    PROCEDURE IN DETAIL:   The patient was met in the holding area and  identified.  The appropriate hip was identified and marked at the operative site.  The patient was then transported to the OR  and  placed under anesthesia per that record.  At that point, the patient was  placed in the supine position and  secured to the operating room table and all bony prominences padded. He received pre-operative antibiotics    The operative lower extremity was prepped from the iliac crest to the distal leg.  Sterile draping was performed.  Time out was performed prior to incision.      Skin incision was made just 2 cm lateral to the ASIS  extending in  line with the tensor fascia lata. Electrocautery was used to control all bleeders. I dissected down sharply to the fascia of the tensor fascia lata was confirmed that the muscle fibers beneath were running posteriorly. I then incised the fascia over the superficial tensor fascia lata in line with the incision. The fascia was elevated off the anterior aspect of the muscle the muscle was retracted posteriorly and protected throughout the case. I then used electrocautery to incise the tensor fascia lata fascia control and all bleeders. Immediately visible was the fat over top of the anterior neck and capsule.  I removed the anterior fat from the capsule and elevated the rectus muscle off of the anterior capsule. I then removed a large time of capsule. The retractors were then placed over the anterior acetabulum as well as around the superior and inferior neck.  I then removed a section of the femoral neck and a napkin ring fashion. Then used the power course to remove the femoral head from the acetabulum and thoroughly irrigated the acetabulum. I sized the femoral head.    I then exposed the deep acetabulum, cleared out any tissue including the ligamentum teres.   After adequate visualization, I excised the labrum, and then sequentially reamed.  I then impacted the acetabular implant into place using fluoroscopy for guidance.  Appropriate version and inclination was confirmed clinically matching their bony anatomy, and with fluoroscopy.  I placed a 1m screw in the posterior/superio position with an excellent bite.  I then placed the polyethylene liner in place  I then adducted the leg and released the external rotators from the posterior femur allowing it to be easily delivered up lateral and anterior to the acetabulum for preparation of the femoral canal.    I then prepared the proximal femur using the cookie-cutter and then sequentially reamed and broached.  A trial broach, neck, and head was  utilized, and I reduced the hip and used floroscopy to assess the neck length and femoral implant.  I then impacted the femoral prosthesis into place into the appropriate version. The hip was then reduced and fluoroscopy confirmed appropriate position. Leg lengths were restored.  I then irrigated the hip copiously again with, and repaired the fascia with Vicryl, followed by monocryl for the subcutaneous tissue, Monocryl for the skin, Steri-Strips and sterile gauze. The patient was then awakened and returned to PACU in stable and satisfactory condition. There were no complications.  POST OPERATIVE PLAN: WBAT, DVT px: SCD's/TED and ASA 325  Edmonia Lynch, MD Orthopedic Surgeon [phone]

## 2016-08-14 ENCOUNTER — Encounter (HOSPITAL_COMMUNITY): Payer: Self-pay

## 2016-08-14 LAB — BASIC METABOLIC PANEL
ANION GAP: 9 (ref 5–15)
BUN: 20 mg/dL (ref 6–20)
CO2: 24 mmol/L (ref 22–32)
Calcium: 8.8 mg/dL — ABNORMAL LOW (ref 8.9–10.3)
Chloride: 100 mmol/L — ABNORMAL LOW (ref 101–111)
Creatinine, Ser: 0.68 mg/dL (ref 0.44–1.00)
GFR calc Af Amer: >60 mL/min (ref >60)
GLUCOSE: 190 mg/dL — AB (ref 65–99)
POTASSIUM: 3.3 mmol/L — AB (ref 3.5–5.1)
SODIUM: 133 mmol/L — AB (ref 135–145)

## 2016-08-14 LAB — CBC
HCT: 33.5 % — ABNORMAL LOW (ref 36.0–46.0)
Hemoglobin: 10.8 g/dL — ABNORMAL LOW (ref 12.0–15.0)
MCH: 28.7 pg (ref 26.0–34.0)
MCHC: 32.2 g/dL (ref 30.0–36.0)
MCV: 89.1 fL (ref 78.0–100.0)
PLATELETS: 273 10*3/uL (ref 150–400)
RBC: 3.76 MIL/uL — AB (ref 3.87–5.11)
RDW: 13 % (ref 11.5–15.5)
WBC: 10.2 10*3/uL (ref 4.0–10.5)

## 2016-08-14 LAB — GLUCOSE, CAPILLARY
GLUCOSE-CAPILLARY: 240 mg/dL — AB (ref 65–99)
GLUCOSE-CAPILLARY: 247 mg/dL — AB (ref 65–99)

## 2016-08-14 NOTE — Discharge Summary (Signed)
Discharge Summary  Patient ID: Danielle Riley MRN: [id number] DOB/AGE: 1956-09-18 60 y.o.  Admit date: 08/13/2016 Discharge date: 08/14/2016  Admission Diagnoses:  Primary osteoarthritis of right hip  Discharge Diagnoses:  Principal Problem:   Primary osteoarthritis of right hip Active Problems:   Dysuria   Concentration deficit   Anxiety state   Diabetes (HCC)   Anemia   Menopause syndrome   Insomnia   High triglycerides   Essential hypertension, benign   Depression   Primary localized osteoarthritis of right hip Acute Blood loss anemia - likely with dilutional component.   Hypokalemia, mild  Past Medical History:  Diagnosis Date  . ADD (attention deficit disorder)   . Allergic rhinitis   . Anxiety   . Arthritis   . Depression   . Diabetes (HCC)   . High risk HPV infection   . Hyperlipidemia   . Hypertension   . Pneumonia    history of 1980    Surgeries: Procedure(s): TOTAL HIP ARTHROPLASTY ANTERIOR APPROACH on 08/13/2016   Consultants (if any):   Discharged Condition: Improved  Plan: D/C IVFs Up with therapy again today. Home w/ HHPT  Subjective: Feeling good.  OOB.  Pain controlled with PO meds.  Tolerating diet.  Urinating.  No CP, SOB.  General: NAD.  Upright in bed.   Resp: clear to auscultation bilaterally Cardio: regular rate and rhythm ABD protuberant, soft, +BS Neurologically intact MSK Neurovascularly intact Sensation intact distally Intact pulses distally Dorsiflexion/Plantar flexion intact Incision: dressing C/D/I   Weight Bearing: Weight Bearing as Tolerated (WBAT) Right leg Dressings: prn.   Hospital Course: Danielle Riley is an 60 y.o. female who was admitted 08/13/2016 with a diagnosis of Primary osteoarthritis of right hip and went to the operating room on 08/13/2016 and underwent the above named procedures.    She was given perioperative antibiotics:  Anti-infectives    Start     Dose/Rate Route Frequency Ordered Stop    08/13/16 1500  ceFAZolin (ANCEF) IVPB 2g/100 mL premix     2 g 200 mL/hr over 30 Minutes Intravenous Every 6 hours 08/13/16 1409 08/13/16 2331   08/13/16 1300  ceFAZolin (ANCEF) IVPB 2g/100 mL premix     2 g 200 mL/hr over 30 Minutes Intravenous To [facility] 08/12/16 2142 08/13/16 0755    .  She was given sequential compression devices, early ambulation, and ASA for DVT prophylaxis.  She benefited maximally from the hospital stay and there were no complications.    Recent vital signs:  Vitals:   08/13/16 2353 08/14/16 0510  BP: (!) 102/57 (!) 110/54  Pulse: 96 91  Resp: 16 18  Temp: 98 F (36.7 C) 97.7 F (36.5 C)    Recent laboratory studies:  Lab Results  Component Value Date   HGB 10.8 (L) 08/14/2016   HGB 14.9 08/02/2016   HGB 14.4 11/16/2013   Lab Results  Component Value Date   WBC 10.2 08/14/2016   PLT 273 08/14/2016   Lab Results  Component Value Date   INR 0.98 08/02/2016   Lab Results  Component Value Date   NA 133 (L) 08/14/2016   K 3.3 (L) 08/14/2016   CL 100 (L) 08/14/2016   CO2 24 08/14/2016   BUN 20 08/14/2016   CREATININE 0.68 08/14/2016   GLUCOSE 190 (H) 08/14/2016    Discharge Medications:     Medication List    TAKE these medications   aspirin EC 325 MG tablet Take 1 tablet (325 mg total)  by mouth daily.   celecoxib 200 MG capsule Commonly known as:  CELEBREX Take 1 capsule by mouth daily.   cetirizine 10 MG tablet Commonly known as:  ZYRTEC Take 10 mg by mouth daily.   docusate sodium 100 MG capsule Commonly known as:  COLACE Take 1 capsule (100 mg total) by mouth 2 (two) times daily. To prevent constipation while taking pain medication.   DULoxetine 60 MG capsule Commonly known as:  CYMBALTA Take 1 capsule by mouth daily.   EPINEPHrine 0.3 mg/0.3 mL Soaj injection Commonly known as:  EPIPEN 2-PAK Inject 0.3 mLs (0.3 mg total) into the muscle once as needed (for allergies).   glucose blood test strip Commonly  known as:  ONE TOUCH ULTRA TEST Check sugar daily   lisinopril-hydrochlorothiazide 20-25 MG tablet Commonly known as:  PRINZIDE,ZESTORETIC Take 0.5 tablets by mouth daily.   methocarbamol 500 MG tablet Commonly known as:  ROBAXIN Take 1 tablet (500 mg total) by mouth every 6 (six) hours as needed for muscle spasms.   omeprazole 20 MG capsule Commonly known as:  PRILOSEC Take 1 capsule (20 mg total) by mouth daily. While taking anti inflammatory medicine daily   ondansetron 4 MG tablet Commonly known as:  ZOFRAN Take 1 tablet (4 mg total) by mouth every 8 (eight) hours as needed for nausea or vomiting.   oxyCODONE-acetaminophen 5-325 MG tablet Commonly known as:  ROXICET Take 1-2 tablets by mouth every 4 (four) hours as needed for severe pain.   pravastatin 40 MG tablet Commonly known as:  PRAVACHOL TAKE 1 TABLET BY MOUTH EVERY NIGHT AT BEDTIME   TRULICITY 1.5 MG/0.5ML Sopn Generic drug:  Dulaglutide Inject 1.5 mg into the skin once a week. Sundays   VYVANSE 70 MG capsule Generic drug:  lisdexamfetamine Take 1 capsule by mouth daily.   XIGDUO XR 04-999 MG Tb24 Generic drug:  Dapagliflozin-Metformin HCl ER Take 1 tablet by mouth 2 (two) times daily.       Diagnostic Studies: Dg C-arm 61-120 Min  Result Date: 08/13/2016 CLINICAL DATA:  Right hip replacement EXAM: OPERATIVE RIGHT HIP WITH PELVIS; DG C-ARM 61-120 MIN COMPARISON:  None. FLUOROSCOPY TIME:  Radiation Exposure Index (as provided by the fluoroscopic device): Not available If the device does not provide the exposure index: Fluoroscopy Time:  13 seconds Number of Acquired Images:  2 FINDINGS: Right hip replacement is noted. No acute bony or soft tissue abnormality is seen. IMPRESSION: Right hip replacement Electronically Signed   By: Alcide CleverMark  Lukens M.D.   On: 08/13/2016 09:39   Dg Hip Operative Unilat With Pelvis Right  Result Date: 08/13/2016 CLINICAL DATA:  Right hip replacement EXAM: OPERATIVE RIGHT HIP WITH  PELVIS; DG C-ARM 61-120 MIN COMPARISON:  None. FLUOROSCOPY TIME:  Radiation Exposure Index (as provided by the fluoroscopic device): Not available If the device does not provide the exposure index: Fluoroscopy Time:  13 seconds Number of Acquired Images:  2 FINDINGS: Right hip replacement is noted. No acute bony or soft tissue abnormality is seen. IMPRESSION: Right hip replacement Electronically Signed   By: Alcide CleverMark  Lukens M.D.   On: 08/13/2016 09:39    Disposition: 01-Home or Self Care w/ HHPT    Follow-up Information    MURPHY, TIMOTHY D, MD Follow up in 2 week(s).   Specialty:  Orthopedic Surgery Contact information: [address] [place] [address] [phone]            Signed: Albina BilletHenry Calvin Martensen III PA-C 08/14/2016, 7:31  AM

## 2016-08-14 NOTE — Evaluation (Signed)
Occupational Therapy Evaluation and Discharge Patient Details Name: Danielle Riley J Garcia MRN: [id number] DOB: 06/03/1956 Today's Date: 08/14/2016    History of Present Illness 60 y.o. female s/p Rt anterior THA. PMH: hypertension, diabetes, ADD.    Clinical Impression   Pt moving well POD 1. Educated in use of AE and compensatory technique for LB bathing and dressing, multiple uses of 3 in 1, safe footwear, and transporting items safely with walker. Pt verbalizing and demonstrating understanding. No further OT needs.   Follow Up Recommendations  No OT follow up    Equipment Recommendations  None recommended by OT    Recommendations for Other Services       Precautions / Restrictions Precautions Precautions: Fall Restrictions Weight Bearing Restrictions: Yes RLE Weight Bearing: Weight bearing as tolerated      Mobility Bed Mobility               General bed mobility comments: pt in chair  Transfers Overall transfer level: Needs assistance Equipment used: Rolling walker (2 wheeled) Transfers: Sit to/from Stand Sit to Stand: Min guard         General transfer comment: cues for technique    Balance     Sitting balance-Leahy Scale: Good       Standing balance-Leahy Scale: Poor                              ADL Overall ADL's : Needs assistance/impaired Eating/Feeding: Independent;Sitting   Grooming: Wash/dry hands;Standing;Supervision/safety   Upper Body Bathing: Set up;Sitting   Lower Body Bathing: Sit to/from stand;Minimal assistance Lower Body Bathing Details (indicate cue type and reason): recommended long bath sponge Upper Body Dressing : Set up;Sitting   Lower Body Dressing: Supervision/safety;With adaptive equipment;Sit to/from stand Lower Body Dressing Details (indicate cue type and reason): practiced use of reacher and sock aide, instructed in safe footwear Toilet Transfer: RW;Ambulation;Min guard;BSC   Toileting- Clothing  Manipulation and Hygiene: Supervision/safety;Sit to/from Nurse, children'sstand     Tub/Shower Transfer Details (indicate cue type and reason): pt does not plan to shower, will practice tub transfer with HHPT Functional mobility during ADLs: Min guard;Rolling walker General ADL Comments: Instructed in transporting items safely with RW.     Vision     Perception     Praxis      Pertinent Vitals/Pain Pain Assessment: Faces Faces Pain Scale: Hurts little more Pain Location: R thigh Pain Descriptors / Indicators: Tightness Pain Intervention(s): Ice applied;Repositioned;Premedicated before session     Hand Dominance Right   Extremity/Trunk Assessment Upper Extremity Assessment Upper Extremity Assessment: Overall WFL for tasks assessed   Lower Extremity Assessment Lower Extremity Assessment: Defer to PT evaluation       Communication Communication Communication: No difficulties   Cognition Arousal/Alertness: Awake/alert Behavior During Therapy: WFL for tasks assessed/performed Overall Cognitive Status: Within Functional Limits for tasks assessed                     General Comments       Exercises       Shoulder Instructions      Home Living Family/patient expects to be discharged to:: Private residence Living Arrangements: Alone Available Help at Discharge: Family;Available 24 hours/day Type of Home: House Home Access: Stairs to enter Entergy CorporationEntrance Stairs-Number of Steps: 1 Entrance Stairs-Rails: None Home Layout: Two level Alternate Level Stairs-Number of Steps: flight Alternate Level Stairs-Rails: Right Bathroom Shower/Tub: Chief Strategy OfficerTub/shower unit   Bathroom Toilet: Standard  Home Equipment: Walker - 4 wheels;Cane - single point;Adaptive equipment Adaptive Equipment: Reacher;Sock aid Additional Comments: sister will be staying with her at D/C, has 3 in 1 in room      Prior Functioning/Environment Level of Independence: Independent with assistive device(s)         Comments: used rollator or cane for ambulation, works as Scientist, 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     OT Diagnosis: Generalized weakness;Acute pain   OT Problem List:     OT Treatment/Interventions:      OT Goals(Current goals can be found in the care plan section) Acute Rehab OT Goals Patient Stated Goal: be active and not use device for ambulation  OT Frequency:     Barriers to D/C:            Co-evaluation              End of Session Equipment Utilized During Treatment: Gait belt;Rolling walker  Activity Tolerance: Patient tolerated treatment well Patient left: in chair;with call bell/phone within reach   Time: 1610-96041051-1123 OT Time Calculation (min): 32 min Charges:  OT General Charges $OT Visit: 1 Procedure OT Evaluation $OT Eval Low Complexity: 1 Procedure OT Treatments $Self Care/Home Management : 8-22 mins G-Codes:    Evern BioMayberry, Addilyne Backs Lynn 08/14/2016, 12:34 PM  [phone]

## 2016-08-14 NOTE — Progress Notes (Signed)
Pt discharged from unit via pvt auto to home with sister. No distress noted. No c/o made. All personal belongings with pt. Discharge instructions and rx reviewed with pt. [facility]

## 2016-08-14 NOTE — Progress Notes (Signed)
Physical Therapy Treatment Patient Details Name: Danielle Riley MRN: [id number] DOB: Apr 26, 1956 Today's Date: 08/14/2016    History of Present Illness 60 y.o. female s/p Rt anterior THA. PMH: hypertension, diabetes, ADD.     PT Comments    Pt progressing with mobility, able to ambulate 100 ft with rw. Will attempt stairs at next session in anticipation of D/C to home when released.   Follow Up Recommendations  Home health PT;Supervision for mobility/OOB     Equipment Recommendations  Rolling walker with 5" wheels;3in1 (PT)    Recommendations for Other Services       Precautions / Restrictions Precautions Precautions: Fall Restrictions Weight Bearing Restrictions: Yes RLE Weight Bearing: Weight bearing as tolerated    Mobility  Bed Mobility               General bed mobility comments: up with nursing upon arrival  Transfers Overall transfer level: Needs assistance Equipment used: Rolling walker (2 wheeled) Transfers: Sit to/from Stand Sit to Stand: Min guard         General transfer comment: reminder for hand placement  Ambulation/Gait Ambulation/Gait assistance: Min guard Ambulation Distance (Feet): 100 Feet Assistive device: Rolling walker (2 wheeled) Gait Pattern/deviations: Step-through pattern Gait velocity: decreased   General Gait Details: cues for even strides   Stairs            Wheelchair Mobility    Modified Rankin (Stroke Patients Only)       Balance Overall balance assessment: Needs assistance Sitting-balance support: No upper extremity supported Sitting balance-Leahy Scale: Good     Standing balance support: During functional activity Standing balance-Leahy Scale: Fair                      Cognition Arousal/Alertness: Awake/alert Behavior During Therapy: WFL for tasks assessed/performed Overall Cognitive Status: Within Functional Limits for tasks assessed                      Exercises Total  Joint Exercises Ankle Circles/Pumps: AROM;Both;15 reps Quad Sets: Strengthening;Right;10 reps Short Arc Quad: Strengthening;Right;10 reps Heel Slides: AAROM;Right;10 reps Hip ABduction/ADduction: Strengthening;Right;10 reps    General Comments        Pertinent Vitals/Pain Pain Assessment: 0-10 Pain Score: 4  Faces Pain Scale: Hurts little more Pain Location: Rt hip Pain Descriptors / Indicators: Sore Pain Intervention(s): Limited activity within patient's tolerance;Monitored during session;Ice applied    Home Living Family/patient expects to be discharged to:: Private residence Living Arrangements: Alone Available Help at Discharge: Family;Available 24 hours/day Type of Home: House Home Access: Stairs to enter Entrance Stairs-Rails: None Home Layout: Two level Home Equipment: Environmental consultantWalker - 4 wheels;Cane - single point;Adaptive equipment Additional Comments: sister will be staying with her at D/C, has 3 in 1 in room    Prior Function Level of Independence: Independent with assistive device(s)      Comments: used rollator or cane for ambulation, works as Scientist, 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    PT Goals (current goals can now be found in the care plan section) Acute Rehab PT Goals Patient Stated Goal: be able to be more active PT Goal Formulation: With patient Time For Goal Achievement: 08/27/16 Potential to Achieve Goals: Good Progress towards PT goals: Progressing toward goals    Frequency  7X/week    PT Plan Current plan remains appropriate    Co-evaluation             End of Session Equipment Utilized During Treatment: Gait  belt Activity Tolerance: Patient tolerated treatment well Patient left: in chair;with call bell/phone within reach;with family/visitor present     Time: [id number] PT Time Calculation (min) (ACUTE ONLY): 31 min  Charges:  $Gait Training: 8-22 mins $Therapeutic Exercise: 8-22 mins                    G Codes:      Christiane HaBenjamin J. July Nickson, PT, CSCS Pager [phone]  [phone] Office [phone]  08/14/2016, 1:39 PM

## 2016-08-14 NOTE — Care Management Note (Signed)
Case Management Note  Patient Details  Name: Danielle Riley MRN: [id number] Date of Birth: [birth year]  Subjective/Objective:  [age] yr old female s/p right total  Hip arthroplasty, anterior approach.                 Action/Plan:  Case manager spoke with patient concerning home health and DME needs. Choice was offered for Home Health agency. Patient was preoperatively setup with [facility], no changes. Patient will have family support at discharge.  Expected Discharge Date:   08/15/16               Expected Discharge Plan:  Home w Home Health Services  In-House Referral:     Discharge planning Services  CM Consult  Post Acute Care Choice:  Home Health, Durable Medical Equipment Choice offered to:  Patient  DME Arranged:  3-N-1, Walker rolling DME Agency:  [facility]  HH Arranged:  [facility]  Status of Service:  Completed, signed off  If discussed at [employer] Length of Stay Meetings, dates discussed:    Additional Comments:  Danielle Riley, Danielle Mcgirr [name], RN 08/14/2016, 11:06 AM

## 2016-08-14 NOTE — Progress Notes (Signed)
PT Progress Note  Patient is making good progress with PT.  From a mobility standpoint anticipate patient will be ready for DC home with family support. Patient denies any questions or concerns following session.     08/14/16 1420  PT Visit Information  Last PT Received On 08/14/16  Assistance Needed +1  History of Present Illness 60 y.o. female s/p Rt anterior THA. PMH: hypertension, diabetes, ADD.   Subjective Data  Subjective Sore but overall doing good.   Patient Stated Goal be able to be more active  Precautions  Precautions Fall  Restrictions  Weight Bearing Restrictions Yes  RLE Weight Bearing WBAT  Pain Assessment  Pain Assessment 0-10  Pain Score 4  Pain Location Rt hip  Pain Descriptors / Indicators Sore  Pain Intervention(s) Limited activity within patient's tolerance;Monitored during session;Ice applied  Cognition  Arousal/Alertness Awake/alert  Behavior During Therapy WFL for tasks assessed/performed  Overall Cognitive Status Within Functional Limits for tasks assessed  Bed Mobility  General bed mobility comments in chair upon arrival  Transfers  Overall transfer level Needs assistance  Equipment used Rolling walker (2 wheeled)  Transfers Sit to/from Stand  Sit to Stand Min guard  General transfer comment good hand position  Ambulation/Gait  Ambulation/Gait assistance Supervision  Ambulation Distance (Feet) 175 Feet  Assistive device Rolling walker (2 wheeled)  Gait Pattern/deviations Step-through pattern  General Gait Details cues for even weightbearing and even strides  Gait velocity decreased  Stairs Yes  Stairs assistance Min guard  Stair Management No rails;One rail Left;Step to pattern;Backwards;Sideways  Number of Stairs 6  General stair comments Up/down 5 stairs with rail and side pattern, up/down 1 step without rails with posterior technique.   Balance  Overall balance assessment Needs assistance  Sitting-balance support No upper extremity  supported  Sitting balance-Leahy Scale Good  Standing balance support During functional activity  Standing balance-Leahy Scale Fair  PT - End of Session  Equipment Utilized During Treatment Gait belt  Activity Tolerance Patient tolerated treatment well  Patient left in chair;with call bell/phone within reach  Nurse Communication Mobility status;Weight bearing status  PT - Assessment/Plan  PT Plan Current plan remains appropriate  PT Frequency (ACUTE ONLY) 7X/week  Follow Up Recommendations Home health PT;Supervision for mobility/OOB  PT equipment Rolling walker with 5" wheels;3in1 (PT)  PT Goal Progression  Progress towards PT goals Progressing toward goals  Acute Rehab PT Goals  PT Goal Formulation With patient  Time For Goal Achievement 08/27/16  Potential to Achieve Goals Good  PT Time Calculation  PT Start Time (ACUTE ONLY) 1351  PT Stop Time (ACUTE ONLY) 1418  PT Time Calculation (min) (ACUTE ONLY) 27 min  PT General Charges  $$ ACUTE PT VISIT 1 Procedure  PT Treatments  $Gait Training 23-37 mins  Christiane HaBenjamin J. Seher Schlagel, PT, CSCS Pager [phone] Office [phone]

## 2017-10-08 IMAGING — RF DG C-ARM 61-120 MIN
1 series · 2 of 2 positions shown · non-contrast
Comparison: None.

CLINICAL DATA: Right hip replacement

EXAM:
OPERATIVE RIGHT HIP WITH PELVIS; DG C-ARM 61-120 MIN

[Series 1: run · 2 of 2 slices shown]
[im 1/2]
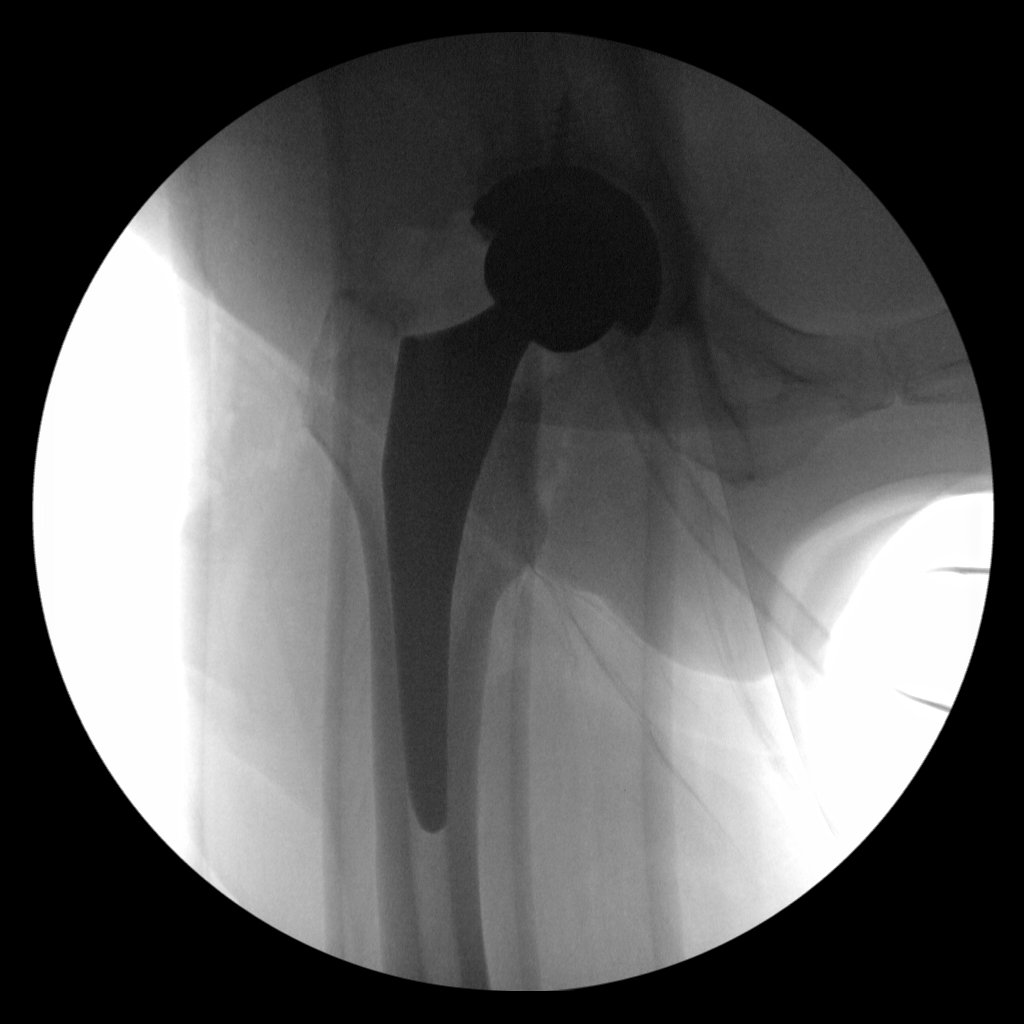
[im 2/2]
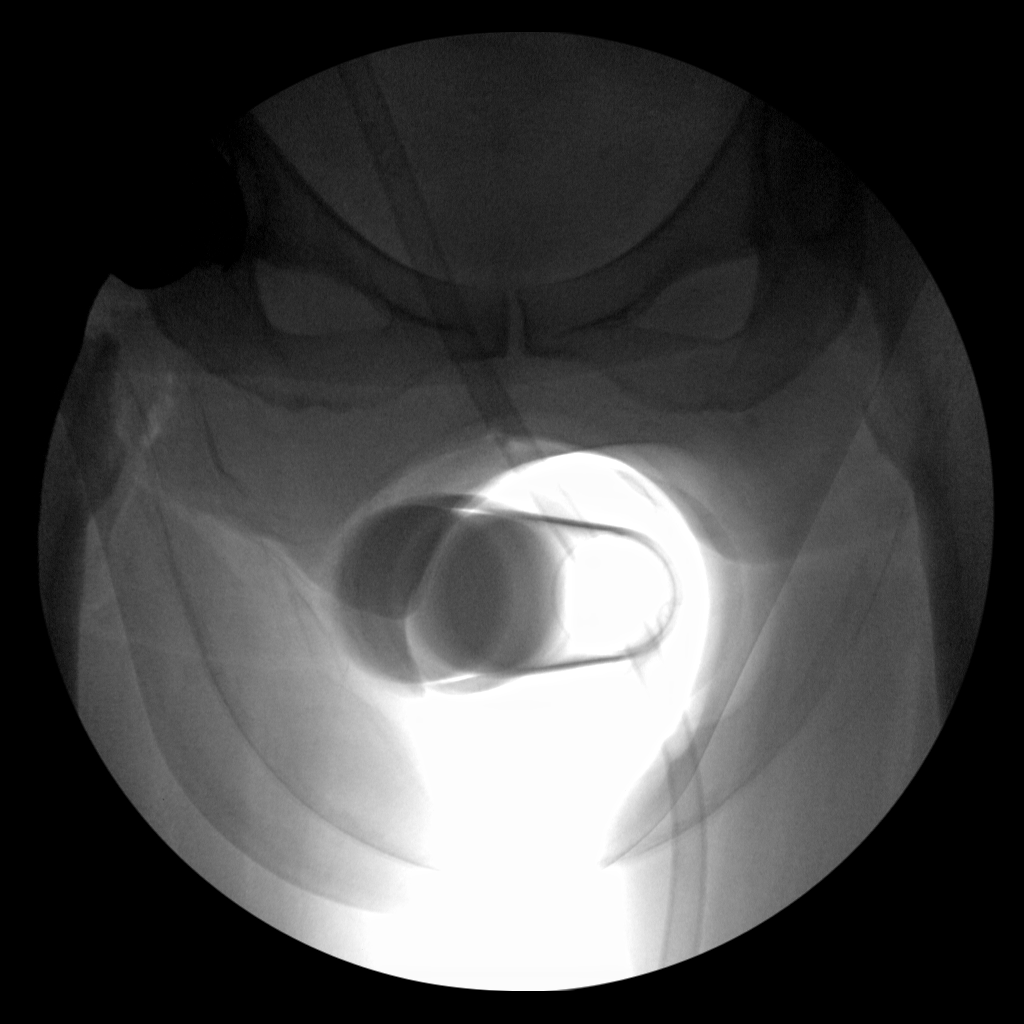

[2 of 2 positions shown; findings below may reference images not displayed]

FLUOROSCOPY TIME:  Radiation Exposure Index (as provided by the
fluoroscopic device): Not available

If the device does not provide the exposure index:

Fluoroscopy Time:  13 seconds

Number of Acquired Images:  2
FINDINGS: Right hip replacement is noted. No acute bony or soft tissue
abnormality is seen.
IMPRESSION: Right hip replacement

## 2018-03-24 ENCOUNTER — Emergency Department (HOSPITAL_BASED_OUTPATIENT_CLINIC_OR_DEPARTMENT_OTHER)
Admission: EM | Admit: 2018-03-24 | Discharge: 2018-03-24 | Disposition: A | Discharge status: Home / Self Care | Payer: Worker's Compensation | Attending: Emergency Medicine | Admitting: Emergency Medicine

## 2018-03-24 ENCOUNTER — Emergency Department (HOSPITAL_BASED_OUTPATIENT_CLINIC_OR_DEPARTMENT_OTHER): Payer: Worker's Compensation

## 2018-03-24 ENCOUNTER — Encounter (HOSPITAL_BASED_OUTPATIENT_CLINIC_OR_DEPARTMENT_OTHER): Payer: Self-pay | Admitting: Emergency Medicine

## 2018-03-24 DIAGNOSIS — T07XXXA Unspecified multiple injuries, initial encounter: Secondary | ICD-10-CM

## 2018-03-24 DIAGNOSIS — Z79899 Other long term (current) drug therapy: Secondary | ICD-10-CM | POA: Diagnosis not present

## 2018-03-24 DIAGNOSIS — S8002XA Contusion of left knee, initial encounter: Secondary | ICD-10-CM | POA: Diagnosis not present

## 2018-03-24 DIAGNOSIS — W010XXA Fall on same level from slipping, tripping and stumbling without subsequent striking against object, initial encounter: Secondary | ICD-10-CM | POA: Diagnosis not present

## 2018-03-24 DIAGNOSIS — I10 Essential (primary) hypertension: Secondary | ICD-10-CM | POA: Diagnosis not present

## 2018-03-24 DIAGNOSIS — M25512 Pain in left shoulder: Secondary | ICD-10-CM | POA: Diagnosis not present

## 2018-03-24 DIAGNOSIS — Z87891 Personal history of nicotine dependence: Secondary | ICD-10-CM | POA: Insufficient documentation

## 2018-03-24 DIAGNOSIS — E119 Type 2 diabetes mellitus without complications: Secondary | ICD-10-CM | POA: Insufficient documentation

## 2018-03-24 DIAGNOSIS — M129 Arthropathy, unspecified: Secondary | ICD-10-CM

## 2018-03-24 DIAGNOSIS — M1990 Unspecified osteoarthritis, unspecified site: Secondary | ICD-10-CM | POA: Insufficient documentation

## 2018-03-24 DIAGNOSIS — Z23 Encounter for immunization: Secondary | ICD-10-CM | POA: Insufficient documentation

## 2018-03-24 DIAGNOSIS — Z91010 Allergy to peanuts: Secondary | ICD-10-CM | POA: Diagnosis not present

## 2018-03-24 DIAGNOSIS — S6992XA Unspecified injury of left wrist, hand and finger(s), initial encounter: Secondary | ICD-10-CM | POA: Diagnosis present

## 2018-03-24 DIAGNOSIS — Y939 Activity, unspecified: Secondary | ICD-10-CM | POA: Insufficient documentation

## 2018-03-24 DIAGNOSIS — Y990 Civilian activity done for income or pay: Secondary | ICD-10-CM | POA: Diagnosis not present

## 2018-03-24 DIAGNOSIS — Y92211 Elementary school as the place of occurrence of the external cause: Secondary | ICD-10-CM | POA: Insufficient documentation

## 2018-03-24 DIAGNOSIS — W19XXXA Unspecified fall, initial encounter: Secondary | ICD-10-CM

## 2018-03-24 DIAGNOSIS — S63502A Unspecified sprain of left wrist, initial encounter: Secondary | ICD-10-CM | POA: Diagnosis not present

## 2018-03-24 DIAGNOSIS — S0083XA Contusion of other part of head, initial encounter: Secondary | ICD-10-CM | POA: Diagnosis not present

## 2018-03-24 MED ORDER — TETANUS-DIPHTH-ACELL PERTUSSIS 5-2.5-18.5 LF-MCG/0.5 IM SUSP
0.5000 mL | Freq: Once | INTRAMUSCULAR | Status: AC
  Administered 2018-03-24: 0.5 mL via INTRAMUSCULAR
  Filled 2018-03-24: qty 0.5

## 2018-03-24 NOTE — ED Notes (Signed)
Please note that the triage and VS were entered by me

## 2018-03-24 NOTE — ED Triage Notes (Signed)
Pt tripped and fell on sidewalk.  She has an abrasion on left knee, left hand, left cheek.  Pt denies any LOC.  No distress at this time. Pt ambulatory, alert and oriented
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
## 2018-03-24 NOTE — Discharge Instructions (Signed)
Alternate between Ibuprofen and Tylenol for pain. Get plenty of rest, use ice on your head/cheek.  Use topical antibiotic ointment and a bandage to your abrasions, keeping them clean and dry while they heal. Ice and elevate your wrist and knee to help with pain/swelling,u sing ice pack for no more than 20 minutes every hour. You may use heat to your shoulder area to help with pain as well, also for no more than 20 minutes per hour. Use wrist brace and knee sleeve for at least 2 weeks for stabilization of wrist and knee. Follow up with your regular doctor in 1 week for recheck of symptoms and ongoing management of your injuries. Return to the ER for changes or worsening symptoms.

## 2018-03-24 NOTE — ED Notes (Signed)
Provider Street informed that Pt wanted to speak to her about her Lt calf and Lt ankle pain.

## 2018-03-24 NOTE — ED Notes (Signed)
NAD at this time. Pt is stable and going home.  

## 2018-03-24 NOTE — ED Notes (Signed)
ED Provider at bedside. 

## 2018-05-26 DIAGNOSIS — J320 Chronic maxillary sinusitis: Secondary | ICD-10-CM | POA: Insufficient documentation

## 2021-07-03 DIAGNOSIS — M545 Low back pain, unspecified: Secondary | ICD-10-CM | POA: Insufficient documentation

## 2022-01-15 DIAGNOSIS — B349 Viral infection, unspecified: Secondary | ICD-10-CM | POA: Insufficient documentation

## 2022-06-06 DIAGNOSIS — E119 Type 2 diabetes mellitus without complications: Secondary | ICD-10-CM | POA: Insufficient documentation

## 2022-06-06 DIAGNOSIS — M791 Myalgia, unspecified site: Secondary | ICD-10-CM | POA: Insufficient documentation

## 2022-09-28 DIAGNOSIS — J302 Other seasonal allergic rhinitis: Secondary | ICD-10-CM | POA: Insufficient documentation

## 2023-10-14 ENCOUNTER — Ambulatory Visit (INDEPENDENT_AMBULATORY_CARE_PROVIDER_SITE_OTHER): Payer: Medicare Other | Admitting: Podiatry

## 2023-10-14 ENCOUNTER — Encounter: Payer: Self-pay | Admitting: Podiatry

## 2023-10-14 DIAGNOSIS — S90222A Contusion of left lesser toe(s) with damage to nail, initial encounter: Secondary | ICD-10-CM | POA: Diagnosis not present

## 2023-10-14 NOTE — Progress Notes (Unsigned)
   Chief Complaint  Patient presents with   Nail Problem    PATIENT STATES THAT HER SECOND TOE ON LEFT FOOT IS BROWN AND HER PRIMARY REFEREED HER TO HERE. PATIENT STATES THAT IT IS NOT PAINFUL AND HAS NOT PUT ANYTHING ON IT .   HPI: 67 y.o. female presents today presents upon referral from her primary care physician to have the left second toe evaluated.  The nail is discolored and the patient believes she may have stubbed it.  She denies any pain at this time.  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   TOTAL HIP ARTHROPLASTY Right 08/13/2016   Procedure: TOTAL HIP ARTHROPLASTY ANTERIOR APPROACH;  Surgeon: Sheral Apley, MD;  Location: [facility] OR;  Service: Orthopedics;  Laterality: Right;    Allergies  Allergen Reactions   Cucumber Extract Anaphylaxis    PICKLES    Lemon Oil Anaphylaxis   Melissa Officinalis Anaphylaxis    LEMONS   Other Anaphylaxis    WALNUTS-Make Tongue Burn   Advil [Ibuprofen] Swelling    SWELLING REACTION UNSPECIFIED    Peanuts [Peanut Oil] Diarrhea    Reports meds are okay; only allergic to food    Physical Exam: Palpable pedal pulses on the left foot.  The left second toenail is 4 mm thick with ecchymotic discoloration.  There is no pain with compression of the nail.  There is no bogginess or fluctuance to the nail.  There is no surrounding erythema.  The nail is well adhered to the nailbed.  There is no pain with range of motion of the second toe at the joints.  Assessment/Plan of Care: 1. Contusion of lesser toe of left foot with damage to nail,  initial encounter     Discussed clinical findings with patient today.  The nail was debrided to decrease bulk and length, and a few other nails were trimmed as a courtesy.  She was informed that the nail may fall off on its own in a few weeks, as it grows out more, due to the bruising causing microtrauma to the nail bed.  She was pleased the nail didn't need to be removed today.  F/u prn    Clerance Lav, DPM, FACFAS [facility]     [address] [place], [place] [address]                Office [phone]  Fax [phone]

## 2023-11-28 ENCOUNTER — Encounter: Payer: Self-pay | Admitting: Podiatry

## 2023-11-28 ENCOUNTER — Ambulatory Visit (INDEPENDENT_AMBULATORY_CARE_PROVIDER_SITE_OTHER): Payer: Medicare Other | Admitting: Podiatry

## 2023-11-28 ENCOUNTER — Ambulatory Visit (INDEPENDENT_AMBULATORY_CARE_PROVIDER_SITE_OTHER): Payer: Medicare Other

## 2023-11-28 VITALS — Ht 66.0 in | Wt 207.0 lb

## 2023-11-28 DIAGNOSIS — M79674 Pain in right toe(s): Secondary | ICD-10-CM

## 2023-11-28 DIAGNOSIS — E1169 Type 2 diabetes mellitus with other specified complication: Secondary | ICD-10-CM

## 2023-11-28 DIAGNOSIS — S92414A Nondisplaced fracture of proximal phalanx of right great toe, initial encounter for closed fracture: Secondary | ICD-10-CM

## 2023-11-28 DIAGNOSIS — S90222D Contusion of left lesser toe(s) with damage to nail, subsequent encounter: Secondary | ICD-10-CM

## 2023-11-28 DIAGNOSIS — Z794 Long term (current) use of insulin: Secondary | ICD-10-CM

## 2023-11-28 NOTE — Progress Notes (Unsigned)
Chief Complaint  Patient presents with   Toe Injury    Pt is here due to right greater toe injury, pt states a can fell on her toe yesterday, area is bruised and slightly swollen.    HPI: 67 y.o. female presents today with above complaint.  Endorses right foot pain and swelling.  Bruising is present.  Significant pain with ambulation.  Patient endorses history of diabetes.  She states that it has been poorly controlled over the past couple years her last A1c was greater than 12.  She does expect her next A1c to be significantly better as she has had medication adjustment, currently taking metformin, Jardiance, Trulicity.  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  General: The patient is alert and oriented x3 in no acute distress.  Dermatology: Right foot edema and ecchymosis is appreciated about the great toe and first MPJ region.  No open wounds, lesions or nodules appreciated  bilaterally.  No bulla appreciated.  Stable old hematoma to the left second toenail is noted  Vascular: Palpable pedal pulses bilaterally. Capillary refill within normal limits.  Right foot localized edema about the right first toe and first MPJ region.  Neurological: Light touch sensation grossly intact bilateral feet.   Musculoskeletal Exam: Tenderness on palpation of right hallux.  Associated swelling and ecchymosis present.  Range of motion testing deferred.  Radiographic Exam: Right foot 11/28/2023 Nondisplaced comminuted fracture appreciated to the right first proximal phalanx.  There is intra-articular involvement of the interphalangeal joint.  Significant bunion deformity present with increased IM angle and arthritic changes to the first MPJ present.  Generalized osteopenia present with cystic changes in the first MPJ.   Assessment/Plan of Care: 1. Pain of right great toe   2. Closed nondisplaced fracture of proximal phalanx of right great toe, initial encounter   3. Type 2 diabetes mellitus with other specified complication, with long-term current use of insulin (HCC)   4. Contusion of lesser toe of left foot with damage to nail, subsequent encounter      No orders of the defined types were placed in this encounter.  None  Discussed clinical findings with patient today.  Plan: -Overall, fracture is nondisplaced.  Will manage  conservatively and have patient weight-bear as tolerated in cam boot - Advised patient importance of tight glucose control and the elevated A1c can lead to decreased healing in addition to other complications with diabetes -Patient relates pain is otherwise well-controlled. Utilize RICE therapy, apply ice as needed -Follow-up in 4 to 6 weeks for serial radiographs.  May return sooner if symptoms worsen or new pedal complaints arise.   Alysse Rathe L. Marchia Bond, AACFAS [facility]     [address] [place], [place] [address]                Office [phone]  Fax [phone]

## 2023-11-28 NOTE — Patient Instructions (Signed)
Utilize RICE therapy: Rest, ice, compress, elevation above the level of the heart  May ice for 20 minutes on, 20 minutes on, may protect skin with towel cloth  Utilize Ace wrap or compression stocking for compression  Keep CAM Walker boot applied at all times when weightbearing  Do not drive using her cam walker boot

## 2024-01-08 ENCOUNTER — Encounter: Payer: Self-pay | Admitting: Podiatry

## 2024-01-08 ENCOUNTER — Ambulatory Visit: Payer: Medicare PPO

## 2024-01-08 ENCOUNTER — Ambulatory Visit: Payer: Medicare PPO | Admitting: Podiatry

## 2024-01-08 DIAGNOSIS — S92414D Nondisplaced fracture of proximal phalanx of right great toe, subsequent encounter for fracture with routine healing: Secondary | ICD-10-CM | POA: Diagnosis not present

## 2024-01-08 NOTE — Progress Notes (Signed)
Chief Complaint  Patient presents with   Fracture    Follow up fracture hallux right   "It really has never hurt, but it does look much better"    HPI: 68 y.o. female presents today with above complaint.  Denies any swelling or bruising.  Pain has been well-controlled.  She has been ambulating in CAM [employer].  Patient endorses history of diabetes.  Last A1c 9, down from 12.   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  General: The patient is alert and oriented x3 in no acute distress.  Dermatology: Normal skin texture and skin turgor noted.  No open wounds, lesions or nodules appreciated.  Edema and ecchymosis to right hallux improved.  Vascular: Palpable pedal pulses bilaterally. Capillary refill within normal limits.  Right foot localized edema about the right first toe and first MPJ region.  Neurological: Light touch sensation grossly intact bilateral feet.    Musculoskeletal Exam: Minimal tenderness on palpation of right hallux.   Range of motion testing deferred.  Radiographic Exam: Right foot 01/08/2024 Nondisplaced comminuted fracture appreciated to the right first proximal phalanx.  There is intra-articular involvement of the interphalangeal joint.  Secondary fracture lines appear resolving. Significant bunion deformity present with increased IM angle and arthritic changes to the first MPJ present.  Generalized osteopenia present with cystic changes in the first MPJ.   Assessment/Plan of Care: 1. Closed nondisplaced fracture of proximal phalanx of right great toe with routine healing, subsequent encounter      No orders of the defined types were placed in this encounter.  None  Discussed clinical findings with patient today.  Plan: - Radiographs reviewed with patient.  Fracture line still visible at this point -No new displacement of the fracture appreciated. - Continue protected weightbearing in CAM Walker boot - Advised patient importance of tight glucose control and the elevated A1c can lead to decreased healing in addition to other complications with diabetes - Continue with RICE therapy -Serum calcium and vitamin D levels ordered to assess for any needed supplementation for fracture healing.  Will follow results accordingly. -Follow-up in 4 weeks for serial radiographs.  May return sooner if symptoms worsen or new pedal complaints arise.  Erina Hamme L. Marchia Bond, AACFAS [facility]     [address] [place], [place] [address]                Office [phone]  Fax [phone]

## 2024-01-09 LAB — CALCIUM: Calcium: 9.9 mg/dL (ref 8.7–10.3)

## 2024-01-09 LAB — VITAMIN D 25 HYDROXY (VIT D DEFICIENCY, FRACTURES): Vit D, 25-Hydroxy: 32.6 ng/mL (ref 30.0–100.0)

## 2024-01-11 ENCOUNTER — Encounter: Payer: Self-pay | Admitting: Podiatry

## 2024-02-06 ENCOUNTER — Ambulatory Visit: Payer: Medicare PPO | Admitting: Podiatry

## 2024-02-06 ENCOUNTER — Encounter: Payer: Self-pay | Admitting: Podiatry

## 2024-02-06 ENCOUNTER — Ambulatory Visit (INDEPENDENT_AMBULATORY_CARE_PROVIDER_SITE_OTHER): Payer: Medicare PPO

## 2024-02-06 DIAGNOSIS — M79674 Pain in right toe(s): Secondary | ICD-10-CM

## 2024-02-06 DIAGNOSIS — S92414D Nondisplaced fracture of proximal phalanx of right great toe, subsequent encounter for fracture with routine healing: Secondary | ICD-10-CM

## 2024-02-06 NOTE — Progress Notes (Signed)
Chief Complaint  Patient presents with   Routine Post Op    Patient states everything has been great no problems nor complains since last visit    HPI: 68 y.o. female presents today with above complaint.  Presents as follow-up for right great toe fracture.  Denies any swelling or bruising.  Denies pain at this point.  She has been ambulating in CAM [employer].  Patient endorses history of diabetes.  Last A1c 9, down from 12.   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   TOTAL HIP ARTHROPLASTY Right 08/13/2016   Procedure: TOTAL HIP ARTHROPLASTY ANTERIOR APPROACH;  Surgeon: Sheral Apley, MD;  Location: [facility] OR;  Service: Orthopedics;  Laterality: Right;    Allergies  Allergen Reactions   Cucumber Extract Anaphylaxis    PICKLES    Lemon Oil Anaphylaxis   Melissa Officinalis Anaphylaxis    LEMONS   Other Anaphylaxis    WALNUTS-Make Tongue Burn   Advil [Ibuprofen] Swelling    SWELLING REACTION UNSPECIFIED    Peanuts [Peanut Oil] Diarrhea    Reports meds are okay; only allergic to food    ROS denies any nausea, vomiting, fever, chills, chest pain, shortness of breath   Physical Exam: There were no vitals filed for this visit.  General: The patient is alert and oriented x3 in no acute distress.  Dermatology: Normal skin texture and skin turgor noted.  No open wounds, lesions or nodules appreciated.  No edema or ecchymosis  Vascular: Palpable pedal pulses bilaterally. Capillary refill within normal limits.  Right first toe nonedematous.  Neurological: Light  touch sensation grossly intact bilateral feet.   Musculoskeletal Exam: No tenderness on palpation of right hallux.   No pain with range of motion to right hallux  Radiographic Exam: Right foot 02/06/2024 Secondary fracture lines appear resolved.  There is trabeculation appreciated across the primary fracture line.  Showing signs of good healing. Significant bunion deformity present with increased IM angle and arthritic changes to the first MPJ present.  Generalized osteopenia present with cystic changes in the first MPJ.   Assessment/Plan of Care: 1. Closed nondisplaced fracture of proximal phalanx of right great toe with routine healing, subsequent encounter      No orders of the defined types were placed in this encounter.  None  Discussed clinical findings with patient today.  Plan: - Radiographs reviewed with patient.  Satisfied with trabeculation across the fracture site. -No new displacement of the fracture appreciated. -Serum vitamin D and calcium levels reviewed with patient within normal limits. - Protected weightbearing in CAM Walker boot, may begin to ween out of the boot and into good supportive sneakers over the next 2-4 weeks. - Advised patient importance of tight glucose control and the elevated A1c can lead to decreased healing in addition to other complications with diabetes - Continue with RICE therapy -Did reiterate that there is intra-articular involvement with  the fracture and that she may develop arthritis to the big toe, this could require surgery down the line.  Patient expressed good understanding. -At this point, follow up as needed if symptoms recur or worsen   Treyden Hakim L. Marchia Bond, AACFAS [facility]     [address] [place], [place] [address]                Office [phone]  Fax [phone]

## 2024-02-06 NOTE — Patient Instructions (Signed)
  30 Minute Rule: Begin to transition to more weight in the right foot in regular shoes with normal daily activities (e.g. going to work or running errands, walking around the house If you have pain and swelling for more than 30 minutes following that activity, it's likely too much too soon and should decrease your distance/activity/weight/time the next time you do it If you have some pain and swelling but doesn't last more than 30 minutes, that activity is OK and you can begin to increase your distance/activity/weight/time the next time you do it

## 2024-02-09 ENCOUNTER — Encounter: Payer: Self-pay | Admitting: Podiatry

## 2024-06-02 DIAGNOSIS — E1165 Type 2 diabetes mellitus with hyperglycemia: Secondary | ICD-10-CM | POA: Diagnosis not present

## 2024-06-02 DIAGNOSIS — I10 Essential (primary) hypertension: Secondary | ICD-10-CM | POA: Diagnosis not present

## 2024-06-08 DIAGNOSIS — Z1331 Encounter for screening for depression: Secondary | ICD-10-CM | POA: Diagnosis not present

## 2024-06-08 DIAGNOSIS — F3341 Major depressive disorder, recurrent, in partial remission: Secondary | ICD-10-CM | POA: Diagnosis not present

## 2024-06-08 DIAGNOSIS — E782 Mixed hyperlipidemia: Secondary | ICD-10-CM | POA: Diagnosis not present

## 2024-06-08 DIAGNOSIS — I10 Essential (primary) hypertension: Secondary | ICD-10-CM | POA: Diagnosis not present

## 2024-06-08 DIAGNOSIS — E1165 Type 2 diabetes mellitus with hyperglycemia: Secondary | ICD-10-CM | POA: Diagnosis not present

## 2024-06-13 DIAGNOSIS — I10 Essential (primary) hypertension: Secondary | ICD-10-CM | POA: Diagnosis not present

## 2024-06-13 DIAGNOSIS — E1165 Type 2 diabetes mellitus with hyperglycemia: Secondary | ICD-10-CM | POA: Diagnosis not present

## 2024-06-21 DIAGNOSIS — I10 Essential (primary) hypertension: Secondary | ICD-10-CM | POA: Diagnosis not present

## 2024-06-21 DIAGNOSIS — E782 Mixed hyperlipidemia: Secondary | ICD-10-CM | POA: Diagnosis not present

## 2024-06-21 DIAGNOSIS — F3341 Major depressive disorder, recurrent, in partial remission: Secondary | ICD-10-CM | POA: Diagnosis not present

## 2024-06-21 DIAGNOSIS — E1165 Type 2 diabetes mellitus with hyperglycemia: Secondary | ICD-10-CM | POA: Diagnosis not present

## 2024-07-13 DIAGNOSIS — I10 Essential (primary) hypertension: Secondary | ICD-10-CM | POA: Diagnosis not present

## 2024-07-13 DIAGNOSIS — E1165 Type 2 diabetes mellitus with hyperglycemia: Secondary | ICD-10-CM | POA: Diagnosis not present

## 2024-07-22 DIAGNOSIS — E782 Mixed hyperlipidemia: Secondary | ICD-10-CM | POA: Diagnosis not present

## 2024-07-22 DIAGNOSIS — E1165 Type 2 diabetes mellitus with hyperglycemia: Secondary | ICD-10-CM | POA: Diagnosis not present

## 2024-07-22 DIAGNOSIS — I10 Essential (primary) hypertension: Secondary | ICD-10-CM | POA: Diagnosis not present

## 2024-07-22 DIAGNOSIS — F3341 Major depressive disorder, recurrent, in partial remission: Secondary | ICD-10-CM | POA: Diagnosis not present

## 2024-08-06 DIAGNOSIS — H2513 Age-related nuclear cataract, bilateral: Secondary | ICD-10-CM | POA: Diagnosis not present

## 2024-08-12 DIAGNOSIS — I10 Essential (primary) hypertension: Secondary | ICD-10-CM | POA: Diagnosis not present

## 2024-08-12 DIAGNOSIS — E1165 Type 2 diabetes mellitus with hyperglycemia: Secondary | ICD-10-CM | POA: Diagnosis not present

## 2024-08-22 DIAGNOSIS — F3341 Major depressive disorder, recurrent, in partial remission: Secondary | ICD-10-CM | POA: Diagnosis not present

## 2024-08-22 DIAGNOSIS — I10 Essential (primary) hypertension: Secondary | ICD-10-CM | POA: Diagnosis not present

## 2024-08-22 DIAGNOSIS — E1165 Type 2 diabetes mellitus with hyperglycemia: Secondary | ICD-10-CM | POA: Diagnosis not present

## 2024-08-22 DIAGNOSIS — E782 Mixed hyperlipidemia: Secondary | ICD-10-CM | POA: Diagnosis not present

## 2024-09-11 DIAGNOSIS — E1165 Type 2 diabetes mellitus with hyperglycemia: Secondary | ICD-10-CM | POA: Diagnosis not present

## 2024-09-11 DIAGNOSIS — I10 Essential (primary) hypertension: Secondary | ICD-10-CM | POA: Diagnosis not present

## 2024-09-21 DIAGNOSIS — E1165 Type 2 diabetes mellitus with hyperglycemia: Secondary | ICD-10-CM | POA: Diagnosis not present

## 2024-09-21 DIAGNOSIS — F3341 Major depressive disorder, recurrent, in partial remission: Secondary | ICD-10-CM | POA: Diagnosis not present

## 2024-09-21 DIAGNOSIS — I10 Essential (primary) hypertension: Secondary | ICD-10-CM | POA: Diagnosis not present

## 2024-09-21 DIAGNOSIS — E782 Mixed hyperlipidemia: Secondary | ICD-10-CM | POA: Diagnosis not present

## 2024-10-08 DIAGNOSIS — E782 Mixed hyperlipidemia: Secondary | ICD-10-CM | POA: Diagnosis not present

## 2024-10-08 DIAGNOSIS — E1165 Type 2 diabetes mellitus with hyperglycemia: Secondary | ICD-10-CM | POA: Diagnosis not present

## 2024-10-13 DIAGNOSIS — E1165 Type 2 diabetes mellitus with hyperglycemia: Secondary | ICD-10-CM | POA: Diagnosis not present

## 2024-10-13 DIAGNOSIS — I10 Essential (primary) hypertension: Secondary | ICD-10-CM | POA: Diagnosis not present

## 2024-10-13 DIAGNOSIS — F909 Attention-deficit hyperactivity disorder, unspecified type: Secondary | ICD-10-CM | POA: Diagnosis not present

## 2024-10-13 DIAGNOSIS — Z124 Encounter for screening for malignant neoplasm of cervix: Secondary | ICD-10-CM | POA: Diagnosis not present

## 2024-10-13 DIAGNOSIS — F3341 Major depressive disorder, recurrent, in partial remission: Secondary | ICD-10-CM | POA: Diagnosis not present

## 2024-10-13 DIAGNOSIS — Z87892 Personal history of anaphylaxis: Secondary | ICD-10-CM | POA: Diagnosis not present

## 2024-10-13 DIAGNOSIS — Z0000 Encounter for general adult medical examination without abnormal findings: Secondary | ICD-10-CM | POA: Diagnosis not present

## 2024-10-22 DIAGNOSIS — I10 Essential (primary) hypertension: Secondary | ICD-10-CM | POA: Diagnosis not present

## 2024-10-22 DIAGNOSIS — E1165 Type 2 diabetes mellitus with hyperglycemia: Secondary | ICD-10-CM | POA: Diagnosis not present

## 2024-10-22 DIAGNOSIS — F3341 Major depressive disorder, recurrent, in partial remission: Secondary | ICD-10-CM | POA: Diagnosis not present

## 2024-10-22 DIAGNOSIS — E782 Mixed hyperlipidemia: Secondary | ICD-10-CM | POA: Diagnosis not present

## 2024-11-11 DIAGNOSIS — Z9189 Other specified personal risk factors, not elsewhere classified: Secondary | ICD-10-CM | POA: Diagnosis not present

## 2024-11-11 DIAGNOSIS — Z01419 Encounter for gynecological examination (general) (routine) without abnormal findings: Secondary | ICD-10-CM | POA: Diagnosis not present

## 2024-11-11 DIAGNOSIS — Z124 Encounter for screening for malignant neoplasm of cervix: Secondary | ICD-10-CM | POA: Diagnosis not present

## 2024-12-01 DIAGNOSIS — J029 Acute pharyngitis, unspecified: Secondary | ICD-10-CM | POA: Diagnosis not present

## 2024-12-01 DIAGNOSIS — R519 Headache, unspecified: Secondary | ICD-10-CM | POA: Diagnosis not present

## 2024-12-01 DIAGNOSIS — R509 Fever, unspecified: Secondary | ICD-10-CM | POA: Diagnosis not present

## 2024-12-01 DIAGNOSIS — B349 Viral infection, unspecified: Secondary | ICD-10-CM | POA: Diagnosis not present

## 2024-12-01 DIAGNOSIS — R52 Pain, unspecified: Secondary | ICD-10-CM | POA: Diagnosis not present
